# Patient Record
Sex: Male | Born: 1975 | Race: White | Hispanic: No | Marital: Married | Smoking: Never smoker
Health system: Southern US, Community
[De-identification: ages and names within clinical notes are randomized; demographics above are authoritative.]

## PROBLEM LIST (undated history)

## (undated) DIAGNOSIS — N200 Calculus of kidney: Secondary | ICD-10-CM

## (undated) HISTORY — DX: Calculus of kidney: N20.0

---
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
## 2007-10-01 ENCOUNTER — Encounter: Payer: Self-pay | Admitting: Internal Medicine

## 2007-12-22 ENCOUNTER — Ambulatory Visit: Payer: Self-pay | Admitting: Internal Medicine

## 2007-12-22 DIAGNOSIS — J45909 Unspecified asthma, uncomplicated: Secondary | ICD-10-CM | POA: Insufficient documentation

## 2009-02-02 ENCOUNTER — Ambulatory Visit: Payer: Self-pay | Admitting: Internal Medicine

## 2009-02-02 LAB — CONVERTED CEMR LAB
Bilirubin Urine: NEGATIVE
Blood in Urine, dipstick: NEGATIVE
Ketones, urine, test strip: NEGATIVE
Urobilinogen, UA: 0.2

## 2009-02-05 LAB — CONVERTED CEMR LAB: TSH: 2.02 microintl units/mL (ref 0.35–5.50)

## 2009-03-12 LAB — CONVERTED CEMR LAB
Cholesterol: 208 mg/dL — ABNORMAL HIGH (ref 0–200)
HDL: 44.3 mg/dL (ref >39.00)
VLDL: 8.2 mg/dL (ref 0.0–40.0)

## 2010-01-22 ENCOUNTER — Ambulatory Visit (HOSPITAL_BASED_OUTPATIENT_CLINIC_OR_DEPARTMENT_OTHER): Admission: RE | Admit: 2010-01-22 | Discharge: 2010-01-22 | Payer: Self-pay | Admitting: Emergency Medicine

## 2010-01-22 ENCOUNTER — Ambulatory Visit: Payer: Self-pay | Admitting: Radiology

## 2010-01-28 ENCOUNTER — Ambulatory Visit: Payer: Self-pay | Admitting: Internal Medicine

## 2010-01-28 LAB — CONVERTED CEMR LAB
ALT: 22 units/L (ref 0–53)
Albumin: 4.8 g/dL (ref 3.5–5.2)
Alkaline Phosphatase: 54 units/L (ref 39–117)
BUN: 17 mg/dL (ref 6–23)
Basophils Absolute: 0 10*3/uL (ref 0.0–0.1)
Basophils Relative: 0.9 % (ref 0.0–3.0)
Bilirubin Urine: NEGATIVE
Bilirubin, Direct: 0.1 mg/dL (ref 0.0–0.3)
Blood in Urine, dipstick: NEGATIVE
Creatinine, Ser: 1.2 mg/dL (ref 0.4–1.5)
GFR calc non Af Amer: 71.85 mL/min (ref >60)
Glucose, Urine, Semiquant: NEGATIVE
HDL: 53 mg/dL (ref >39.00)
Hemoglobin: 16.4 g/dL (ref 13.0–17.0)
Lymphs Abs: 1.2 10*3/uL (ref 0.7–4.0)
Monocytes Absolute: 0.8 10*3/uL (ref 0.1–1.0)
Monocytes Relative: 17.7 % — ABNORMAL HIGH (ref 3.0–12.0)
Neutrophils Relative %: 51.1 % (ref 43.0–77.0)
Nitrite: NEGATIVE
Potassium: 4.7 meq/L (ref 3.5–5.1)
RBC: 5.04 M/uL (ref 4.22–5.81)
Specific Gravity, Urine: >=1.03
TSH: 3.16 microintl units/mL (ref 0.35–5.50)
Total Bilirubin: 0.7 mg/dL (ref 0.3–1.2)
Triglycerides: 98 mg/dL (ref 0.0–149.0)
Urobilinogen, UA: 0.2
WBC: 4.7 10*3/uL (ref 4.5–10.5)
pH: 5.5

## 2010-02-12 ENCOUNTER — Ambulatory Visit: Payer: Self-pay | Admitting: Internal Medicine

## 2010-05-23 ENCOUNTER — Ambulatory Visit: Payer: Self-pay | Admitting: Internal Medicine

## 2010-09-10 NOTE — Assessment & Plan Note (Signed)
Summary: FLU SHOT//SLM  Nurse Visit   Vital Signs:  Patient profile:   35 year old male Temp:     98.5 degrees F oral  Vitals Entered By: Mervin Kung CMA Duncan Dull) (May 23, 2010 10:36 AM)  Allergies: No Known Drug Allergies  Orders Added: 1)  Admin 1st Vaccine [90471] 2)  Flu Vaccine 88yrs + [27253]   Flu Vaccine Consent Questions     Do you have a history of severe allergic reactions to this vaccine? no    Any prior history of allergic reactions to egg and/or gelatin? no    Do you have a sensitivity to the preservative Thimersol? no    Do you have a past history of Guillan-Barre Syndrome? no    Do you currently have an acute febrile illness? no    Have you ever had a severe reaction to latex? no    Vaccine information given and explained to patient? yes    Are you currently pregnant? no    Lot Number:[id number]   Exp Date:02/08/2011   Site Given  Left Deltoid IM. Nicki Guadalajara Fergerson CMA Duncan Dull)  May 23, 2010 10:37 AM

## 2010-09-10 NOTE — Assessment & Plan Note (Signed)
Summary: CPX/CJR ok per bmp/njr   Vital Signs:  Patient profile:   35 year old male Height:      66.5 inches Weight:      165 pounds BMI:     26.33 Pulse rate:   64 / minute Pulse rhythm:   regular Resp:     12 per minute BP sitting:   108 / 78  (left arm) Cuff size:   regular  Vitals Entered By: Gladis Riffle, RN (February 12, 2010 8:17 AM) CC: cpx, labs done--S/P kidney stone 2-3 weeks ago Is Patient Diabetic? No   CC:  cpx and labs done--S/P kidney stone 2-3 weeks ago.  History of Present Illness: CPX  Preventive Screening-Counseling & Management  Alcohol-Tobacco     Alcohol drinks/day: <1     Smoking Status: never  Current Problems (verified): 1)  Preventive Health Care  (ICD-V70.0) 2)  Asthma  (ICD-493.90)  Current Medications (verified): 1)  Proair Hfa 108 (90 Base) Mcg/act  Aers (Albuterol Sulfate) .... As Needed  Allergies (verified): No Known Drug Allergies  Past History:  Past Medical History: Asthma kidney stone  Physical Exam  General:  Well-developed,well-nourished,in no acute distress; alert,appropriate and cooperative throughout examination Head:  normocephalic and atraumatic.   Eyes:  pupils equal and pupils round.   Ears:  R ear normal and L ear normal.   Neck:  No deformities, masses, or tenderness noted. Chest Wall:  No deformities, masses, tenderness or gynecomastia noted. Lungs:  Normal respiratory effort, chest expands symmetrically. Lungs are clear to auscultation, no crackles or wheezes. Heart:  normal rate and regular rhythm.   Abdomen:  Bowel sounds positive,abdomen soft and non-tender without masses, organomegaly or hernias noted. Msk:  No deformity or scoliosis noted of thoracic or lumbar spine.   Pulses:  R radial normal and L radial normal.   Neurologic:  cranial nerves II-XII intact and gait normal.     Impression & Recommendations:  Problem # 1:  PREVENTIVE HEALTH CARE (ICD-V70.0) health maint UTD  Problem # 2:  ASTHMA  (ICD-493.90) no sxs.  His updated medication list for this problem includes:    Proair Hfa 108 (90 Base) Mcg/act Aers (Albuterol sulfate) .Marland Kitchen... As needed  Complete Medication List: 1)  Proair Hfa 108 (90 Base) Mcg/act Aers (Albuterol sulfate) .... As needed  Appended Document: CPX/CJR ok per bmp/njr   Immunizations Administered:  Tetanus Vaccine:    Vaccine Type: Tdap    Site: right deltoid    Mfr: GlaxoSmithKline    Dose: 0.5 ml    Route: IM    Given by: Gladis Riffle, RN    Exp. Date: 11/02/2011    Lot #: [id number]

## 2011-02-05 ENCOUNTER — Other Ambulatory Visit (INDEPENDENT_AMBULATORY_CARE_PROVIDER_SITE_OTHER): Payer: No Typology Code available for payment source

## 2011-02-05 DIAGNOSIS — Z0000 Encounter for general adult medical examination without abnormal findings: Secondary | ICD-10-CM

## 2011-02-05 LAB — BASIC METABOLIC PANEL
BUN: 16 mg/dL (ref 6–23)
Calcium: 9.6 mg/dL (ref 8.4–10.5)
GFR: 75.65 mL/min (ref >60.00)
Glucose, Bld: 91 mg/dL (ref 70–99)
Potassium: 4.4 mEq/L (ref 3.5–5.1)
Sodium: 141 mEq/L (ref 135–145)

## 2011-02-05 LAB — POCT URINALYSIS DIPSTICK
Bilirubin, UA: NEGATIVE
Glucose, UA: NEGATIVE
Leukocytes, UA: NEGATIVE
Spec Grav, UA: 1.03
Urobilinogen, UA: 0.2
pH, UA: 5.5

## 2011-02-05 LAB — HEPATIC FUNCTION PANEL
ALT: 25 U/L (ref 0–53)
Albumin: 4.8 g/dL (ref 3.5–5.2)

## 2011-02-05 LAB — CBC WITH DIFFERENTIAL/PLATELET
Basophils Absolute: 0 10*3/uL (ref 0.0–0.1)
HCT: 41.8 % (ref 39.0–52.0)
Hemoglobin: 14.4 g/dL (ref 13.0–17.0)
Monocytes Relative: 15.3 % — ABNORMAL HIGH (ref 3.0–12.0)
Neutrophils Relative %: 20.5 % — ABNORMAL LOW (ref 43.0–77.0)

## 2011-02-09 ENCOUNTER — Other Ambulatory Visit (HOSPITAL_COMMUNITY)
Admission: RE | Admit: 2011-02-09 | Discharge: 2011-02-09 | Disposition: A | Discharge status: Home / Self Care | Payer: No Typology Code available for payment source | Source: Ambulatory Visit | Attending: Internal Medicine | Admitting: Internal Medicine

## 2011-02-09 ENCOUNTER — Other Ambulatory Visit: Payer: Self-pay | Admitting: Internal Medicine

## 2011-02-09 DIAGNOSIS — D72820 Lymphocytosis (symptomatic): Secondary | ICD-10-CM | POA: Insufficient documentation

## 2011-03-14 ENCOUNTER — Encounter: Payer: Self-pay | Admitting: Internal Medicine

## 2011-03-18 ENCOUNTER — Ambulatory Visit (INDEPENDENT_AMBULATORY_CARE_PROVIDER_SITE_OTHER): Payer: No Typology Code available for payment source | Admitting: Internal Medicine

## 2011-03-18 ENCOUNTER — Encounter: Payer: Self-pay | Admitting: Internal Medicine

## 2011-03-18 VITALS — BP 102/68 | HR 80 | Temp 98.1°F | Ht 66.5 in | Wt 156.0 lb

## 2011-03-18 DIAGNOSIS — Z0000 Encounter for general adult medical examination without abnormal findings: Secondary | ICD-10-CM

## 2011-03-18 DIAGNOSIS — D72819 Decreased white blood cell count, unspecified: Secondary | ICD-10-CM

## 2011-03-18 LAB — CBC WITH DIFFERENTIAL/PLATELET
Basophils Absolute: 0 10*3/uL (ref 0.0–0.1)
Eosinophils Absolute: 0.2 10*3/uL (ref 0.0–0.7)
HCT: 43 % (ref 39.0–52.0)
Lymphocytes Relative: 35.3 % (ref 12.0–46.0)
Monocytes Absolute: 0.6 10*3/uL (ref 0.1–1.0)
Monocytes Relative: 16.4 % — ABNORMAL HIGH (ref 3.0–12.0)
Neutro Abs: 1.6 10*3/uL (ref 1.4–7.7)
Neutrophils Relative %: 42.4 % — ABNORMAL LOW (ref 43.0–77.0)
Platelets: 155 10*3/uL (ref 150.0–400.0)
RBC: 4.63 Mil/uL (ref 4.22–5.81)
RDW: 13.8 % (ref 11.5–14.6)

## 2011-03-18 NOTE — Progress Notes (Signed)
  Subjective:    Patient ID: Ralph Barnett, male    DOB: 1976-07-18, 35 y.o.   MRN: [id number]  HPI cpx  Some back and pleuritic chest pain Back is "stiff" in the a.m. Mid scapular. Improves during the day (can be exacerbated by lifting) Some right sided chest pain---seems to have started after URI sxs---prolonged cough. Occasionally pain can be sharp.   Cough has resolved.   Past Medical History  Diagnosis Date  . Asthma   . Kidney stone    Past Surgical History  Procedure Date  . Wrist surgery     left wrist    reports that he has never smoked. He does not have any smokeless tobacco history on file. He reports that he drinks alcohol. He reports that he does not use illicit drugs. family history includes Atrial fibrillation in his father and Lymphoma in his mother. No Known Allergies    Review of Systems  patient denies chest pain, shortness of breath, orthopnea. Denies lower extremity edema, abdominal pain, change in appetite, change in bowel movements. Patient denies rashes, musculoskeletal complaints. No other specific complaints in a complete review of systems.      Objective:   Physical Exam   well-developed well-nourished male in no acute distress. HEENT exam atraumatic, normocephalic, neck supple without jugular venous distention. Chest clear to auscultation cardiac exam S1-S2 are regular. Abdominal exam overweight with bowel sounds, soft and nontender. Extremities no edema. Neurologic exam is alert with a normal gait.     Assessment & Plan:  Well Visit

## 2011-03-20 ENCOUNTER — Ambulatory Visit (HOSPITAL_BASED_OUTPATIENT_CLINIC_OR_DEPARTMENT_OTHER)
Admission: RE | Admit: 2011-03-20 | Discharge: 2011-03-20 | Disposition: A | Discharge status: Home / Self Care | Payer: No Typology Code available for payment source | Source: Ambulatory Visit | Attending: Family Medicine | Admitting: Family Medicine

## 2011-03-20 ENCOUNTER — Encounter: Payer: Self-pay | Admitting: Family Medicine

## 2011-03-20 ENCOUNTER — Ambulatory Visit (INDEPENDENT_AMBULATORY_CARE_PROVIDER_SITE_OTHER): Payer: No Typology Code available for payment source | Admitting: Family Medicine

## 2011-03-20 VITALS — BP 106/69 | HR 84 | Temp 98.2°F | Ht 67.0 in | Wt 158.4 lb

## 2011-03-20 DIAGNOSIS — Z87828 Personal history of other (healed) physical injury and trauma: Secondary | ICD-10-CM

## 2011-03-20 DIAGNOSIS — M546 Pain in thoracic spine: Secondary | ICD-10-CM

## 2011-03-21 ENCOUNTER — Encounter: Payer: Self-pay | Admitting: Family Medicine

## 2011-03-21 DIAGNOSIS — M546 Pain in thoracic spine: Secondary | ICD-10-CM | POA: Insufficient documentation

## 2011-03-21 NOTE — Assessment & Plan Note (Signed)
Thoracic back pain - AP and lateral x-rays negative for compression fracture, infiltrative disease.  No significant malalignment.  Reassured patient.  Discussed this is generally a difficult area to rehab (deep thoracic muscles, erector spinae).  Shown scapular stabilization exercises and thoracic stretches.  Heat, nsaids as needed.  Advised him would consider PT as next step if not improving over next 4-6 weeks.  MRI is another option but would only consider if not improving as expected with PT.

## 2011-03-21 NOTE — Progress Notes (Signed)
Subjective:    Patient ID: Ralph Barnett, male    DOB: 25-Oct-1975, 35 y.o.   MRN: [id number]  PCP: Swords  HPI 35 yo M here for upper back/low neck pain  Patient recalls having lower back pain early this year that felt musculoskeletal without any injury. Was worse with movement, has largely subsided. No radiation into legs.  No numbness or tingling No bowel/bladder dysfunction. Then over the past several weeks has developed upper midline thoracic pain/low cervical pain. He does recall earlier this summer he dove into a pool from a height of about 6 meters and had some pain following this but it was not severe, no swelling or bruising.  Did not have any trouble with that dive itself. Over the past 2 days has worsened especially. Taking naprosyn occasionally. Pain worse in morning, better throughout day. No bowel/bladder dysfunction or numbness/tingling following this either. No radiation into arms. Yesterday when rolling around mat playing with his child he felt a pop/crack in same area of his upper thoracic spine. Denies fevers, chills, night sweats.  Past Medical History  Diagnosis Date  . Asthma   . Kidney stone     Current Outpatient Prescriptions on File Prior to Visit  Medication Sig Dispense Refill  . albuterol (PROAIR HFA) 108 (90 BASE) MCG/ACT inhaler Inhale 2 puffs into the lungs every 6 (six) hours as needed.          Past Surgical History  Procedure Date  . Wrist surgery 1997    left wrist for scapular nonunion     No Known Allergies  History   Social History  . Marital Status: Married    Spouse Name: N/A    Number of Children: N/A  . Years of Education: N/A   Occupational History  . Not on file.   Social History Main Topics  . Smoking status: Never Smoker   . Smokeless tobacco: Not on file  . Alcohol Use: Yes  . Drug Use: No  . Sexually Active: Not on file   Other Topics Concern  . Not on file   Social History Narrative  . No narrative on  file    Family History  Problem Relation Age of Onset  . Lymphoma Mother   . Atrial fibrillation Father   . Diabetes Neg Hx   . Heart attack Neg Hx   . Hyperlipidemia Neg Hx   . Hypertension Neg Hx   . Sudden death Neg Hx     BP 106/69  Pulse 84  Temp(Src) 98.2 F (36.8 C) (Oral)  Ht 5\' 7"  (1.702 m)  Wt 158 lb 6.4 oz (71.85 kg)  BMI 24.81 kg/m2  Review of Systems See HPI above.    Objective:   Physical Exam Gen: NAD  Neck: No gross deformity, swelling, bruising. No paraspinal TTP.  No midline/bony TTP. FROM neck - pain with extension at upper t-spine in midline. BUE strength 5/5.  Sensation intact to light touch.  2+ equal reflexes in biceps, 1+ triceps, brachioradialis tendons. Negative spurlings. NV intact distal BUEs.  Back: No gross deformity, scoliosis. No paraspinal TTP lumbar spine.  No midline or bony TTP lumbar spine but midline and immediate bilateral paraspinal TTP about T2 and T3.  No increased pain/severe pain with reflex hammer to spinous processes here. FROM with mild pain on full flexion. Strength LEs 5/5 all muscle groups.  3+ MSRs in patellar, 2+ achilles tendons, equal bilaterally. Negative SLRs. Sensation intact to light touch bilaterally.  Assessment & Plan:  1. Thoracic back pain - AP and lateral x-rays negative for compression fracture, infiltrative disease.  No significant malalignment.  Reassured patient.  Discussed this is generally a difficult area to rehab (deep thoracic muscles, erector spinae).  Shown scapular stabilization exercises and thoracic stretches.  Heat, nsaids as needed.  Advised him would consider PT as next step if not improving over next 4-6 weeks.  MRI is another option but would only consider if not improving as expected with PT.

## 2011-05-13 ENCOUNTER — Ambulatory Visit (INDEPENDENT_AMBULATORY_CARE_PROVIDER_SITE_OTHER): Payer: No Typology Code available for payment source | Admitting: Internal Medicine

## 2011-05-13 ENCOUNTER — Ambulatory Visit (INDEPENDENT_AMBULATORY_CARE_PROVIDER_SITE_OTHER)
Admission: RE | Admit: 2011-05-13 | Discharge: 2011-05-13 | Disposition: A | Discharge status: Home / Self Care | Payer: No Typology Code available for payment source | Source: Ambulatory Visit | Attending: Internal Medicine | Admitting: Internal Medicine

## 2011-05-13 DIAGNOSIS — R079 Chest pain, unspecified: Secondary | ICD-10-CM

## 2011-05-13 DIAGNOSIS — Z23 Encounter for immunization: Secondary | ICD-10-CM

## 2011-05-13 LAB — CBC WITH DIFFERENTIAL/PLATELET
Basophils Relative: 0.4 % (ref 0.0–3.0)
Eosinophils Absolute: 0.1 10*3/uL (ref 0.0–0.7)
HCT: 42.5 % (ref 39.0–52.0)
Monocytes Relative: 13.3 % — ABNORMAL HIGH (ref 3.0–12.0)
Neutro Abs: 1.2 10*3/uL — ABNORMAL LOW (ref 1.4–7.7)
Neutrophils Relative %: 36.2 % — ABNORMAL LOW (ref 43.0–77.0)
WBC: 3.5 10*3/uL — ABNORMAL LOW (ref 4.5–10.5)

## 2011-05-13 LAB — SEDIMENTATION RATE: Sed Rate: 9 mm/hr (ref 0–22)

## 2011-05-13 NOTE — Progress Notes (Signed)
  Subjective:    Patient ID: Ralph Barnett, male    DOB: 03/25/1976, 35 y.o.   MRN: [id number]  HPI  Unusual story of rib/chest pain. Mechanical in nature, bilateral. Ongoing for 6 weeks. Started right sided but then heard and felt a "pop" on left side. Some pain with cough/sneeze. No fever or chills. No weight loss. Admits to some fatigue. Note recent neutropenia---had resolved.   No unusual exposures, no recent travel Family members healthy    Review of Systems  patient denies chest pain, shortness of breath, orthopnea. Denies lower extremity edema, abdominal pain, change in appetite, change in bowel movements. Patient denies rashes, musculoskeletal complaints. No other specific complaints in a complete review of systems.      Objective:   Physical Exam  well-developed well-nourished male in no acute distress. HEENT exam atraumatic, normocephalic, neck supple without jugular venous distention. Chest clear to auscultation cardiac exam S1-S2 are regular. Abdominal exam overweight with bowel sounds, soft and nontender. Extremities no edema. Neurologic exam is alert with a normal gait.    Assessment & Plan:

## 2011-05-14 LAB — D-DIMER, QUANTITATIVE: D-Dimer, Quant: 0.91 ug/mL-FEU — ABNORMAL HIGH (ref 0.00–0.48)

## 2011-05-14 LAB — C-REACTIVE PROTEIN: CRP: 0.14 mg/dL (ref <0.60)

## 2011-05-15 ENCOUNTER — Other Ambulatory Visit: Payer: Self-pay | Admitting: Internal Medicine

## 2011-05-15 ENCOUNTER — Encounter: Payer: Self-pay | Admitting: Internal Medicine

## 2011-05-15 ENCOUNTER — Other Ambulatory Visit (INDEPENDENT_AMBULATORY_CARE_PROVIDER_SITE_OTHER): Payer: No Typology Code available for payment source

## 2011-05-15 ENCOUNTER — Ambulatory Visit (HOSPITAL_COMMUNITY)
Admission: RE | Admit: 2011-05-15 | Discharge: 2011-05-15 | Disposition: A | Discharge status: Home / Self Care | Payer: No Typology Code available for payment source | Source: Ambulatory Visit | Attending: Internal Medicine | Admitting: Internal Medicine

## 2011-05-15 DIAGNOSIS — R937 Abnormal findings on diagnostic imaging of other parts of musculoskeletal system: Secondary | ICD-10-CM | POA: Insufficient documentation

## 2011-05-15 DIAGNOSIS — R071 Chest pain on breathing: Secondary | ICD-10-CM | POA: Insufficient documentation

## 2011-05-15 DIAGNOSIS — R079 Chest pain, unspecified: Secondary | ICD-10-CM | POA: Insufficient documentation

## 2011-05-15 DIAGNOSIS — R7989 Other specified abnormal findings of blood chemistry: Secondary | ICD-10-CM | POA: Insufficient documentation

## 2011-05-15 DIAGNOSIS — M899 Disorder of bone, unspecified: Secondary | ICD-10-CM

## 2011-05-15 DIAGNOSIS — M549 Dorsalgia, unspecified: Secondary | ICD-10-CM

## 2011-05-15 DIAGNOSIS — M898X9 Other specified disorders of bone, unspecified site: Secondary | ICD-10-CM | POA: Insufficient documentation

## 2011-05-15 LAB — HEPATIC FUNCTION PANEL
AST: 26 U/L (ref 0–37)
Alkaline Phosphatase: 118 U/L — ABNORMAL HIGH (ref 39–117)
Total Bilirubin: 0.9 mg/dL (ref 0.3–1.2)

## 2011-05-15 LAB — HCG, QUANTITATIVE, PREGNANCY: hCG, Beta Chain, Quant, S: 0.62 m[IU]/mL

## 2011-05-15 LAB — BASIC METABOLIC PANEL
BUN: 16 mg/dL (ref 6–23)
Chloride: 101 mEq/L (ref 96–112)
Sodium: 137 mEq/L (ref 135–145)

## 2011-05-15 MED ORDER — IOHEXOL 350 MG/ML SOLN
100.0000 mL | Freq: Once | INTRAVENOUS | Status: AC | PRN
  Administered 2011-05-15: 100 mL via INTRAVENOUS

## 2011-05-15 MED ORDER — MELOXICAM 15 MG PO TABS: 15.0000 mg | ORAL_TABLET | Freq: Every day | ORAL | Status: DC

## 2011-05-15 NOTE — Assessment & Plan Note (Addendum)
Unusual story of persistent chest pain. Migratory nature at times.  Needs further evaluation.  Check labs today. Basic labs reviewed Note recent leukopenia/neutropenia---has essentially resolved---douubt related  Later---ddimer elevated---CT angio

## 2011-05-16 LAB — CBC WITH DIFFERENTIAL/PLATELET
Eosinophils Relative: 2.3 % (ref 0.0–5.0)
Monocytes Relative: 11.4 % (ref 3.0–12.0)
Neutrophils Relative %: 46.7 % (ref 43.0–77.0)
Platelets: 186 10*3/uL (ref 150.0–400.0)
WBC: 3.7 10*3/uL — ABNORMAL LOW (ref 4.5–10.5)

## 2011-05-16 LAB — CALCIUM, IONIZED: Calcium, Ion: 1.37 mmol/L — ABNORMAL HIGH (ref 1.12–1.32)

## 2011-05-16 LAB — AFP TUMOR MARKER: AFP-Tumor Marker: 2 ng/mL (ref 0.0–8.0)

## 2011-05-19 ENCOUNTER — Encounter: Payer: No Typology Code available for payment source | Admitting: Oncology

## 2011-05-19 ENCOUNTER — Other Ambulatory Visit: Payer: Self-pay | Admitting: *Deleted

## 2011-05-19 LAB — SPEP & IFE WITH QIG
IgA: 9 mg/dL — ABNORMAL LOW (ref 68–379)
IgG (Immunoglobin G), Serum: 233 mg/dL — ABNORMAL LOW (ref 650–1600)
Total Protein, Serum Electrophoresis: 6.9 g/dL (ref 6.0–8.3)

## 2011-05-19 LAB — PTH, INTACT AND CALCIUM
Calcium, Total (PTH): 10.6 mg/dL — ABNORMAL HIGH (ref 8.4–10.5)
PTH: <2.5 pg/mL — ABNORMAL LOW (ref 14.0–72.0)

## 2011-05-20 LAB — KAPPA/LAMBDA LIGHT CHAINS: Kappa free light chain: 10.4 mg/dL — ABNORMAL HIGH (ref 0.33–1.94)

## 2011-05-21 ENCOUNTER — Other Ambulatory Visit (HOSPITAL_COMMUNITY)
Admission: RE | Admit: 2011-05-21 | Discharge: 2011-05-21 | Disposition: A | Discharge status: Home / Self Care | Payer: No Typology Code available for payment source | Source: Ambulatory Visit | Attending: Oncology | Admitting: Oncology

## 2011-05-21 ENCOUNTER — Other Ambulatory Visit: Payer: Self-pay | Admitting: Oncology

## 2011-05-21 ENCOUNTER — Encounter (HOSPITAL_BASED_OUTPATIENT_CLINIC_OR_DEPARTMENT_OTHER): Payer: No Typology Code available for payment source | Admitting: Oncology

## 2011-05-21 ENCOUNTER — Ambulatory Visit (HOSPITAL_COMMUNITY)
Admission: RE | Admit: 2011-05-21 | Discharge: 2011-05-21 | Disposition: A | Discharge status: Home / Self Care | Payer: No Typology Code available for payment source | Source: Ambulatory Visit | Attending: Oncology | Admitting: Oncology

## 2011-05-21 DIAGNOSIS — C9000 Multiple myeloma not having achieved remission: Secondary | ICD-10-CM

## 2011-05-21 DIAGNOSIS — C801 Malignant (primary) neoplasm, unspecified: Secondary | ICD-10-CM

## 2011-05-21 DIAGNOSIS — D801 Nonfamilial hypogammaglobulinemia: Secondary | ICD-10-CM

## 2011-05-21 DIAGNOSIS — R071 Chest pain on breathing: Secondary | ICD-10-CM | POA: Insufficient documentation

## 2011-05-21 DIAGNOSIS — Z1389 Encounter for screening for other disorder: Secondary | ICD-10-CM | POA: Insufficient documentation

## 2011-05-21 DIAGNOSIS — M899 Disorder of bone, unspecified: Secondary | ICD-10-CM | POA: Insufficient documentation

## 2011-05-21 DIAGNOSIS — M549 Dorsalgia, unspecified: Secondary | ICD-10-CM | POA: Insufficient documentation

## 2011-05-21 DIAGNOSIS — M25559 Pain in unspecified hip: Secondary | ICD-10-CM | POA: Insufficient documentation

## 2011-05-21 LAB — CBC WITH DIFFERENTIAL/PLATELET
Eosinophils Absolute: 0.1 10*3/uL (ref 0.0–0.5)
LYMPH%: 51.2 % — ABNORMAL HIGH (ref 14.0–49.0)
MCHC: 35 g/dL (ref 32.0–36.0)
MCV: 89.7 fL (ref 79.3–98.0)
MONO%: 13.2 % (ref 0.0–14.0)
NEUT#: 1.1 10*3/uL — ABNORMAL LOW (ref 1.5–6.5)
NEUT%: 31.4 % — ABNORMAL LOW (ref 39.0–75.0)
Platelets: 205 10*3/uL (ref 140–400)
RBC: 4.46 10*6/uL (ref 4.20–5.82)
nRBC: 0 % (ref 0–0)

## 2011-05-21 LAB — UIFE/LIGHT CHAINS/TP QN, 24-HR UR
Albumin, U: DETECTED
Alpha 1, Urine: DETECTED — AB
Free Kappa/Lambda Ratio: 22.33 ratio — ABNORMAL HIGH (ref 2.04–10.37)
Free Lambda Excretion/Day: 0.68 mg/d
Total Protein, Urine: 1.2 mg/dL

## 2011-05-22 ENCOUNTER — Ambulatory Visit
Admission: RE | Admit: 2011-05-22 | Discharge: 2011-05-22 | Disposition: A | Discharge status: Home / Self Care | Payer: No Typology Code available for payment source | Source: Ambulatory Visit | Attending: Oncology | Admitting: Oncology

## 2011-05-22 ENCOUNTER — Encounter (HOSPITAL_BASED_OUTPATIENT_CLINIC_OR_DEPARTMENT_OTHER): Payer: No Typology Code available for payment source | Admitting: Oncology

## 2011-05-22 DIAGNOSIS — C9000 Multiple myeloma not having achieved remission: Secondary | ICD-10-CM

## 2011-05-22 MED ORDER — GADOBENATE DIMEGLUMINE 529 MG/ML IV SOLN
14.0000 mL | Freq: Once | INTRAVENOUS | Status: AC | PRN
  Administered 2011-05-22: 14 mL via INTRAVENOUS

## 2011-05-27 LAB — LACTATE DEHYDROGENASE: LDH: 127 U/L (ref 94–250)

## 2011-05-27 LAB — COMPREHENSIVE METABOLIC PANEL
Alkaline Phosphatase: 120 U/L — ABNORMAL HIGH (ref 39–117)
BUN: 14 mg/dL (ref 6–23)
CO2: 25 mEq/L (ref 19–32)
Creatinine, Ser: 1.29 mg/dL (ref 0.50–1.35)
Glucose, Bld: 93 mg/dL (ref 70–99)
Sodium: 142 mEq/L (ref 135–145)
Total Bilirubin: 0.4 mg/dL (ref 0.3–1.2)
Total Protein: 6.7 g/dL (ref 6.0–8.3)

## 2011-05-27 LAB — EPSTEIN-BARR VIRUS VCA, IGG: EBV VCA IgG: 0.02 {ISR}

## 2011-05-27 LAB — EPSTEIN-BARR VIRUS EARLY D ANTIGEN ANTIBODY, IGG: EBV EA IgG: 0.07 {ISR}

## 2011-05-27 LAB — IMMUNOFIXATION ELECTROPHORESIS
IgA: 10 mg/dL — ABNORMAL LOW (ref 68–379)
IgG (Immunoglobin G), Serum: 257 mg/dL — ABNORMAL LOW (ref 650–1600)
IgM, Serum: <4 mg/dL — ABNORMAL LOW (ref 41–251)
Total Protein, Serum Electrophoresis: 6.7 g/dL (ref 6.0–8.3)

## 2011-05-27 LAB — HEPATITIS PANEL, ACUTE
HCV Ab: NEGATIVE
Hepatitis B Surface Ag: NEGATIVE

## 2011-05-27 LAB — CMV IGM: CMV IgM: 0.04 (ref <0.90)

## 2011-05-27 LAB — EPSTEIN-BARR VIRUS VCA, IGM: EBV VCA IgM: 0.02 {ISR}

## 2011-05-28 ENCOUNTER — Encounter (HOSPITAL_BASED_OUTPATIENT_CLINIC_OR_DEPARTMENT_OTHER): Payer: No Typology Code available for payment source | Admitting: Oncology

## 2011-05-28 DIAGNOSIS — C9000 Multiple myeloma not having achieved remission: Secondary | ICD-10-CM

## 2011-05-28 DIAGNOSIS — Z5112 Encounter for antineoplastic immunotherapy: Secondary | ICD-10-CM

## 2011-05-29 ENCOUNTER — Other Ambulatory Visit (HOSPITAL_COMMUNITY): Payer: No Typology Code available for payment source

## 2011-05-29 ENCOUNTER — Ambulatory Visit (INDEPENDENT_AMBULATORY_CARE_PROVIDER_SITE_OTHER): Payer: No Typology Code available for payment source | Admitting: Internal Medicine

## 2011-05-29 DIAGNOSIS — Z23 Encounter for immunization: Secondary | ICD-10-CM

## 2011-06-02 ENCOUNTER — Other Ambulatory Visit: Payer: Self-pay | Admitting: Oncology

## 2011-06-02 ENCOUNTER — Encounter (HOSPITAL_BASED_OUTPATIENT_CLINIC_OR_DEPARTMENT_OTHER): Payer: No Typology Code available for payment source | Admitting: Oncology

## 2011-06-02 DIAGNOSIS — D492 Neoplasm of unspecified behavior of bone, soft tissue, and skin: Secondary | ICD-10-CM

## 2011-06-02 DIAGNOSIS — E8809 Other disorders of plasma-protein metabolism, not elsewhere classified: Secondary | ICD-10-CM

## 2011-06-02 LAB — CBC WITH DIFFERENTIAL/PLATELET
BASO%: 0.3 % (ref 0.0–2.0)
Eosinophils Absolute: 0.1 10*3/uL (ref 0.0–0.5)
LYMPH%: 19.8 % (ref 14.0–49.0)
MCHC: 34.4 g/dL (ref 32.0–36.0)
MONO#: 0.3 10*3/uL (ref 0.1–0.9)
NEUT#: 4.4 10*3/uL (ref 1.5–6.5)
RBC: 4.39 10*6/uL (ref 4.20–5.82)
RDW: 13.8 % (ref 11.0–14.6)
WBC: 6.1 10*3/uL (ref 4.0–10.3)

## 2011-06-05 ENCOUNTER — Encounter (HOSPITAL_BASED_OUTPATIENT_CLINIC_OR_DEPARTMENT_OTHER): Payer: No Typology Code available for payment source | Admitting: Oncology

## 2011-06-05 DIAGNOSIS — C9000 Multiple myeloma not having achieved remission: Secondary | ICD-10-CM

## 2011-06-05 DIAGNOSIS — Z5112 Encounter for antineoplastic immunotherapy: Secondary | ICD-10-CM

## 2011-06-09 ENCOUNTER — Other Ambulatory Visit: Payer: Self-pay | Admitting: Oncology

## 2011-06-09 ENCOUNTER — Encounter (HOSPITAL_BASED_OUTPATIENT_CLINIC_OR_DEPARTMENT_OTHER): Payer: No Typology Code available for payment source | Admitting: Oncology

## 2011-06-09 DIAGNOSIS — C9000 Multiple myeloma not having achieved remission: Secondary | ICD-10-CM

## 2011-06-09 DIAGNOSIS — Z5112 Encounter for antineoplastic immunotherapy: Secondary | ICD-10-CM

## 2011-06-09 DIAGNOSIS — E8809 Other disorders of plasma-protein metabolism, not elsewhere classified: Secondary | ICD-10-CM

## 2011-06-09 DIAGNOSIS — D492 Neoplasm of unspecified behavior of bone, soft tissue, and skin: Secondary | ICD-10-CM

## 2011-06-09 LAB — CBC WITH DIFFERENTIAL/PLATELET
BASO%: 0.3 % (ref 0.0–2.0)
Eosinophils Absolute: 0.2 10*3/uL (ref 0.0–0.5)
HCT: 40.6 % (ref 38.4–49.9)
LYMPH%: 19.9 % (ref 14.0–49.0)
MCHC: 33.7 g/dL (ref 32.0–36.0)
MONO#: 0.5 10*3/uL (ref 0.1–0.9)
NEUT#: 4.6 10*3/uL (ref 1.5–6.5)
NEUT%: 68.6 % (ref 39.0–75.0)
Platelets: 128 10*3/uL — ABNORMAL LOW (ref 140–400)
RBC: 4.4 10*6/uL (ref 4.20–5.82)
WBC: 6.8 10*3/uL (ref 4.0–10.3)
lymph#: 1.3 10*3/uL (ref 0.9–3.3)
nRBC: 0 % (ref 0–0)

## 2011-06-16 ENCOUNTER — Other Ambulatory Visit: Payer: Self-pay

## 2011-06-16 ENCOUNTER — Telehealth: Payer: Self-pay | Admitting: *Deleted

## 2011-06-16 MED ORDER — LENALIDOMIDE 25 MG PO CAPS: ORAL_CAPSULE | ORAL | Status: DC

## 2011-06-16 NOTE — Telephone Encounter (Signed)
RECEIVED A FAX FROM [facility] CONCERNING A PRESCRIPTION REFILL REQUEST FOR REVLIMID. THIS REQUEST WAS PLACED IN DR.GRANFORTUNA'S IN BOX.

## 2011-06-17 ENCOUNTER — Ambulatory Visit (HOSPITAL_BASED_OUTPATIENT_CLINIC_OR_DEPARTMENT_OTHER): Payer: No Typology Code available for payment source | Admitting: Oncology

## 2011-06-17 ENCOUNTER — Other Ambulatory Visit: Payer: Self-pay | Admitting: Oncology

## 2011-06-17 ENCOUNTER — Encounter: Payer: Self-pay | Admitting: Oncology

## 2011-06-17 ENCOUNTER — Other Ambulatory Visit (HOSPITAL_BASED_OUTPATIENT_CLINIC_OR_DEPARTMENT_OTHER): Payer: No Typology Code available for payment source | Admitting: Lab

## 2011-06-17 DIAGNOSIS — D492 Neoplasm of unspecified behavior of bone, soft tissue, and skin: Secondary | ICD-10-CM

## 2011-06-17 DIAGNOSIS — K5900 Constipation, unspecified: Secondary | ICD-10-CM

## 2011-06-17 DIAGNOSIS — R05 Cough: Secondary | ICD-10-CM

## 2011-06-17 DIAGNOSIS — E8809 Other disorders of plasma-protein metabolism, not elsewhere classified: Secondary | ICD-10-CM

## 2011-06-17 DIAGNOSIS — D72819 Decreased white blood cell count, unspecified: Secondary | ICD-10-CM

## 2011-06-17 DIAGNOSIS — C9000 Multiple myeloma not having achieved remission: Secondary | ICD-10-CM

## 2011-06-17 LAB — CBC WITH DIFFERENTIAL/PLATELET
BASO%: 0.3 % (ref 0.0–2.0)
EOS%: 5.3 % (ref 0.0–7.0)
HCT: 37 % — ABNORMAL LOW (ref 38.4–49.9)
LYMPH%: 38.4 % (ref 14.0–49.0)
MCH: 31.1 pg (ref 27.2–33.4)
MCHC: 33.5 g/dL (ref 32.0–36.0)
NEUT%: 35.7 % — ABNORMAL LOW (ref 39.0–75.0)
lymph#: 1.4 10*3/uL (ref 0.9–3.3)

## 2011-06-17 NOTE — Progress Notes (Signed)
CC:   Jaclyn Prime. Greggory Stallion, M.D. 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  He was not anemic.  Calcium was borderline elevated.  Serum total protein and albumin were normal.  Creatinine borderline elevated 1.2 to 1.5 on various readings.  He had a mild leukopenia which was chronic.  Diagnosis initially suspected when a D-dimer was done in view of pleuritic chest symptoms and was elevated which led to a CT angiogram done 05/15/2011 which did not show any evidence for pulmonary emboli, but showed numerous lytic bone lesions in the spine, ribs and sternum.  Further evaluation showed suppression of all immunoglobulins in the serum.  No monoclonal protein seen on IFE, but a serum free light chain analysis showed a mild elevation of kappa free light chains at 10.4 mg%. 24-hour urine for total protein did not show elevated protein or any monoclonal free light chains.  Beta2-microglobulin only borderline elevated at 3.95.  MRI scans of the spine showed diffuse bone involvement, but no evidence for any impending cord compression.  Heaviest area of involvement was actually in the sacral area and lumbar spine.  Bone marrow biopsy done 05/21/2011 showed a hypercellular marrow with 42% plasma cells.  Many of these were immature.  There were large aggregates and sheets of plasma cells in some areas.  There were prominent Dutcher bodies in many of the cells.  Immunohistochemical stains were strongly positive for CD138 with kappa and lambda stain showing kappa light chain restriction.  After full discussion of treatment options, we mutually elected to initiate treatment with induction program based on study published by Dr. Shirlean Mylar last July in Blood using a classic day 1, day 4, day 8, day 15  Velcade schedule 1.3 mg/sq m except giving the drug subcutaneously instead of IV, pulse dexamethasone total dose 40 mg per week, and Revlimid 25 mg day 1 through 14 of a 21-day cycle.  While initial evaluation was in progress, I did give him an induction treatment with 4 days of dexamethasone at 40 mg daily and a single dose of Velcade.  Overall, he is tolerating therapy well.  Bone pain is much better controlled.  He is just using ibuprofen at this time.  He did see Dr. Marlaine Hind yesterday at [facility] in anticipation of high- dose IV melphalan with autologous stem cell support in the near future after he completes approximately 4 cycles of induction treatment outlined above.  So far, only side effect from the treatment is constipation and some of this is related to previous low-dose narcotics he was taking for his back pain.  He has not developed any paresthesias.  He has had a mild cough over the last few days, nonproductive.  No fever.  He is on prophylactic acyclovir, dose increased from 400 b.i.d. to 800 b.i.d. per Dr. Hezzie Bump recommendation yesterday, and low level thromboprophylaxis with aspirin dose increased from 81 mg to 325 mg daily per Dr. Hezzie Bump recommendation yesterday.  PHYSICAL EXAMINATION:  Vital Signs:  Blood pressure 107/69, pulse 79 and regular, temp 97.1.  Head and neck:  Normal.  Pharynx:  No erythema or exudate.  Lungs:  Clear and resonant to percussion.  Heart:  Regular cardiac rhythm.  No murmur.  Abdomen:  Soft, nontender.  Extremities: No edema.  No calf tenderness.  Neurologic:  Motor strength 5/5.  Reflexes 2+ symmetric.  Sensation intact to vibration over the fingertips by tuning fork exam.  LABORATORY DATA:  Hemoglobin 12.4, hematocrit 37, white count 3600 with 36% neutrophils, 38% lymphocytes, 20 monocytes, and platelets 206,000 at the nadir from cycle 1 of RVD.  Chem profile and serum light chain analysis pending.  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  PLAN:  Continued induction RVD as outlined above and then re-evaluation at [facility] in [place] for consolidation with high- dose chemotherapy with autologous stem cell support.  In addition to the RVD, I forgot to mention he is also going to get monthly Zometa and has already had 1 dose.  We discussed the possible rare side effect of osteonecrosis of the jaw today.    ______________________________ [name], M.D., F.A.C.P. JMG/MEDQ  D:  06/17/2011  T:  06/17/2011  Job:  [id number]

## 2011-06-18 LAB — COMPREHENSIVE METABOLIC PANEL
AST: 25 U/L (ref 0–37)
BUN: 11 mg/dL (ref 6–23)
Calcium: 9 mg/dL (ref 8.4–10.5)
Chloride: 104 mEq/L (ref 96–112)
Creatinine, Ser: 1.26 mg/dL (ref 0.50–1.35)
Total Bilirubin: 0.5 mg/dL (ref 0.3–1.2)

## 2011-06-18 LAB — KAPPA/LAMBDA LIGHT CHAINS: Kappa:Lambda Ratio: 518.97 — ABNORMAL HIGH (ref 0.26–1.65)

## 2011-06-20 ENCOUNTER — Telehealth: Payer: Self-pay | Admitting: Oncology

## 2011-06-20 NOTE — Telephone Encounter (Addendum)
Received fax from [facility] on pt. stating that revlimid script was filled & scheduled for delivery at pt's home 06/19/11.

## 2011-06-20 NOTE — Telephone Encounter (Signed)
Pt aware of appts and PET Scan on 11/20 0645am @ [facility]

## 2011-06-23 ENCOUNTER — Ambulatory Visit: Payer: No Typology Code available for payment source

## 2011-06-23 ENCOUNTER — Other Ambulatory Visit (HOSPITAL_BASED_OUTPATIENT_CLINIC_OR_DEPARTMENT_OTHER): Payer: No Typology Code available for payment source | Admitting: Lab

## 2011-06-23 ENCOUNTER — Ambulatory Visit (HOSPITAL_BASED_OUTPATIENT_CLINIC_OR_DEPARTMENT_OTHER): Payer: No Typology Code available for payment source

## 2011-06-23 ENCOUNTER — Other Ambulatory Visit: Payer: Self-pay | Admitting: Oncology

## 2011-06-23 DIAGNOSIS — C9000 Multiple myeloma not having achieved remission: Secondary | ICD-10-CM

## 2011-06-23 DIAGNOSIS — M899 Disorder of bone, unspecified: Secondary | ICD-10-CM

## 2011-06-23 LAB — CBC WITH DIFFERENTIAL/PLATELET
BASO%: 0.6 % (ref 0.0–2.0)
Eosinophils Absolute: 0.1 10*3/uL (ref 0.0–0.5)
HCT: 37 % — ABNORMAL LOW (ref 38.4–49.9)
MCHC: 34.3 g/dL (ref 32.0–36.0)
MONO#: 0.5 10*3/uL (ref 0.1–0.9)
NEUT#: 0.9 10*3/uL — ABNORMAL LOW (ref 1.5–6.5)
RBC: 4.04 10*6/uL — ABNORMAL LOW (ref 4.20–5.82)
WBC: 3.5 10*3/uL — ABNORMAL LOW (ref 4.0–10.3)
lymph#: 2.1 10*3/uL (ref 0.9–3.3)
nRBC: 0 % (ref 0–0)

## 2011-06-23 MED ORDER — ZOLEDRONIC ACID 4 MG/5ML IV CONC
4.0000 mg | Freq: Once | INTRAVENOUS | Status: DC
  Filled 2011-06-23: qty 5

## 2011-06-23 MED ORDER — BORTEZOMIB CHEMO SQ INJECTION 3.5 MG (2.5MG/ML): 1.3000 mg/m2 | Freq: Once | INTRAMUSCULAR | Status: DC

## 2011-06-23 MED ORDER — ZOLEDRONIC ACID 4 MG/100ML IV SOLN
4.0000 mg | Freq: Once | INTRAVENOUS | Status: AC
  Administered 2011-06-23: 4 mg via INTRAVENOUS
  Filled 2011-06-23: qty 100

## 2011-06-23 NOTE — Progress Notes (Signed)
Pt at [facility] for Velcade today - held d/t low ANC.  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  Per Dr Cyndie Chime, if pt wishes to work, he can take Cipro until his counts recover.  Pt reports he will not work.  Does not wish to have Cipro called in at this time. dph

## 2011-06-26 ENCOUNTER — Other Ambulatory Visit: Payer: No Typology Code available for payment source | Admitting: Lab

## 2011-06-26 ENCOUNTER — Ambulatory Visit: Payer: No Typology Code available for payment source

## 2011-07-01 ENCOUNTER — Other Ambulatory Visit: Payer: Self-pay | Admitting: Oncology

## 2011-07-01 ENCOUNTER — Ambulatory Visit (HOSPITAL_BASED_OUTPATIENT_CLINIC_OR_DEPARTMENT_OTHER): Payer: No Typology Code available for payment source

## 2011-07-01 ENCOUNTER — Telehealth: Payer: Self-pay | Admitting: Oncology

## 2011-07-01 ENCOUNTER — Other Ambulatory Visit: Payer: Self-pay | Admitting: *Deleted

## 2011-07-01 ENCOUNTER — Other Ambulatory Visit (HOSPITAL_BASED_OUTPATIENT_CLINIC_OR_DEPARTMENT_OTHER): Payer: No Typology Code available for payment source | Admitting: Lab

## 2011-07-01 ENCOUNTER — Encounter (HOSPITAL_COMMUNITY): Payer: Self-pay

## 2011-07-01 ENCOUNTER — Encounter (HOSPITAL_COMMUNITY)
Admission: RE | Admit: 2011-07-01 | Discharge: 2011-07-01 | Disposition: A | Discharge status: Home / Self Care | Payer: No Typology Code available for payment source | Source: Ambulatory Visit | Attending: Oncology | Admitting: Oncology

## 2011-07-01 DIAGNOSIS — Z5112 Encounter for antineoplastic immunotherapy: Secondary | ICD-10-CM

## 2011-07-01 DIAGNOSIS — M899 Disorder of bone, unspecified: Secondary | ICD-10-CM

## 2011-07-01 DIAGNOSIS — C9000 Multiple myeloma not having achieved remission: Secondary | ICD-10-CM

## 2011-07-01 LAB — CBC WITH DIFFERENTIAL/PLATELET
BASO%: 2 % (ref 0.0–2.0)
LYMPH%: 59.1 % — ABNORMAL HIGH (ref 14.0–49.0)
MCHC: 34 g/dL (ref 32.0–36.0)
MONO#: 0.5 10*3/uL (ref 0.1–0.9)
Platelets: 187 10*3/uL (ref 140–400)
RBC: 4.27 10*6/uL (ref 4.20–5.82)
RDW: 14.3 % (ref 11.0–14.6)
WBC: 2.8 10*3/uL — ABNORMAL LOW (ref 4.0–10.3)

## 2011-07-01 MED ORDER — BORTEZOMIB CHEMO SQ INJECTION 3.5 MG (2.5MG/ML)
1.3000 mg/m2 | Freq: Once | INTRAMUSCULAR | Status: AC
  Administered 2011-07-01: 2.5 mg via SUBCUTANEOUS
  Filled 2011-07-01: qty 2.5

## 2011-07-01 MED ORDER — ONDANSETRON HCL 8 MG PO TABS: 8.0000 mg | ORAL_TABLET | Freq: Once | ORAL | Status: DC

## 2011-07-01 MED ORDER — FLUDEOXYGLUCOSE F - 18 (FDG) INJECTION
18.0000 | Freq: Once | INTRAVENOUS | Status: AC | PRN
  Administered 2011-07-01: 18 via INTRAVENOUS

## 2011-07-01 NOTE — Telephone Encounter (Signed)
New tx orders given today. Pt given apps for nov thru [facility] Dew for nurse rquesting lb order for 11/26, 12/17 and for neulasta inj tomorrow.

## 2011-07-01 NOTE — Progress Notes (Signed)
Per Dr. Cyndie Chime- ok to treat with Velcade despite ANC.

## 2011-07-01 NOTE — Telephone Encounter (Signed)
Added appts for 11/21, 11/26 and 12/17. lmonvm for pt re these appts d/t. Pt given other appts for nov thru jan prior to leaving today and was aware i would call w/appts above.

## 2011-07-02 ENCOUNTER — Ambulatory Visit (HOSPITAL_BASED_OUTPATIENT_CLINIC_OR_DEPARTMENT_OTHER): Payer: No Typology Code available for payment source

## 2011-07-02 DIAGNOSIS — C9000 Multiple myeloma not having achieved remission: Secondary | ICD-10-CM

## 2011-07-02 DIAGNOSIS — M899 Disorder of bone, unspecified: Secondary | ICD-10-CM

## 2011-07-02 LAB — GLUCOSE, CAPILLARY: Glucose-Capillary: 94 mg/dL (ref 70–99)

## 2011-07-02 MED ORDER — PEGFILGRASTIM INJECTION 6 MG/0.6ML
6.0000 mg | Freq: Once | SUBCUTANEOUS | Status: AC
  Administered 2011-07-02: 6 mg via SUBCUTANEOUS
  Filled 2011-07-02: qty 0.6

## 2011-07-04 ENCOUNTER — Ambulatory Visit (HOSPITAL_BASED_OUTPATIENT_CLINIC_OR_DEPARTMENT_OTHER): Payer: No Typology Code available for payment source

## 2011-07-04 ENCOUNTER — Other Ambulatory Visit: Payer: Self-pay | Admitting: Oncology

## 2011-07-04 ENCOUNTER — Other Ambulatory Visit: Payer: No Typology Code available for payment source | Admitting: Lab

## 2011-07-04 DIAGNOSIS — Z5112 Encounter for antineoplastic immunotherapy: Secondary | ICD-10-CM

## 2011-07-04 DIAGNOSIS — C9000 Multiple myeloma not having achieved remission: Secondary | ICD-10-CM

## 2011-07-04 DIAGNOSIS — M899 Disorder of bone, unspecified: Secondary | ICD-10-CM

## 2011-07-04 LAB — CBC WITH DIFFERENTIAL/PLATELET
Basophils Absolute: 0 10*3/uL (ref 0.0–0.1)
Eosinophils Absolute: 0.1 10*3/uL (ref 0.0–0.5)
HGB: 13.8 g/dL (ref 13.0–17.1)
NEUT#: 13.5 10*3/uL — ABNORMAL HIGH (ref 1.5–6.5)
RDW: 14.5 % (ref 11.0–14.6)
lymph#: 1.3 10*3/uL (ref 0.9–3.3)

## 2011-07-04 MED ORDER — BORTEZOMIB CHEMO SQ INJECTION 3.5 MG (2.5MG/ML)
1.3000 mg/m2 | Freq: Once | INTRAMUSCULAR | Status: AC
  Administered 2011-07-04: 2.5 mg via SUBCUTANEOUS
  Filled 2011-07-04: qty 2.5

## 2011-07-04 MED ORDER — ONDANSETRON HCL 8 MG PO TABS
8.0000 mg | ORAL_TABLET | Freq: Once | ORAL | Status: AC
  Administered 2011-07-04: 8 mg via ORAL

## 2011-07-04 NOTE — Progress Notes (Signed)
Verbal consent given by Dr. Cyndie Chime to give Velcade SQ despite  lastr ANC on 07/01/11 of 0.5

## 2011-07-07 ENCOUNTER — Other Ambulatory Visit: Payer: No Typology Code available for payment source | Admitting: Lab

## 2011-07-07 ENCOUNTER — Other Ambulatory Visit: Payer: Self-pay | Admitting: Oncology

## 2011-07-07 ENCOUNTER — Ambulatory Visit (HOSPITAL_BASED_OUTPATIENT_CLINIC_OR_DEPARTMENT_OTHER): Payer: No Typology Code available for payment source

## 2011-07-07 DIAGNOSIS — Z5112 Encounter for antineoplastic immunotherapy: Secondary | ICD-10-CM

## 2011-07-07 DIAGNOSIS — C9000 Multiple myeloma not having achieved remission: Secondary | ICD-10-CM

## 2011-07-07 DIAGNOSIS — M899 Disorder of bone, unspecified: Secondary | ICD-10-CM

## 2011-07-07 LAB — CBC WITH DIFFERENTIAL/PLATELET
Basophils Absolute: 0.1 10*3/uL (ref 0.0–0.1)
Eosinophils Absolute: 0.3 10*3/uL (ref 0.0–0.5)
HGB: 13.4 g/dL (ref 13.0–17.1)
LYMPH%: 11.7 % — ABNORMAL LOW (ref 14.0–49.0)
MCV: 94.7 fL (ref 79.3–98.0)
MONO%: 12.8 % (ref 0.0–14.0)
NEUT#: 11.6 10*3/uL — ABNORMAL HIGH (ref 1.5–6.5)
Platelets: 93 10*3/uL — ABNORMAL LOW (ref 140–400)

## 2011-07-07 MED ORDER — BORTEZOMIB CHEMO SQ INJECTION 3.5 MG (2.5MG/ML)
1.3000 mg/m2 | Freq: Once | INTRAMUSCULAR | Status: AC
  Administered 2011-07-07: 2.5 mg via SUBCUTANEOUS
  Filled 2011-07-07: qty 2.5

## 2011-07-10 ENCOUNTER — Other Ambulatory Visit: Payer: Self-pay | Admitting: Oncology

## 2011-07-10 ENCOUNTER — Ambulatory Visit (HOSPITAL_BASED_OUTPATIENT_CLINIC_OR_DEPARTMENT_OTHER): Payer: PRIVATE HEALTH INSURANCE

## 2011-07-10 ENCOUNTER — Ambulatory Visit (HOSPITAL_BASED_OUTPATIENT_CLINIC_OR_DEPARTMENT_OTHER): Payer: No Typology Code available for payment source

## 2011-07-10 VITALS — BP 119/65 | HR 82 | Temp 97.7°F

## 2011-07-10 DIAGNOSIS — M899 Disorder of bone, unspecified: Secondary | ICD-10-CM

## 2011-07-10 DIAGNOSIS — C9000 Multiple myeloma not having achieved remission: Secondary | ICD-10-CM

## 2011-07-10 DIAGNOSIS — D492 Neoplasm of unspecified behavior of bone, soft tissue, and skin: Secondary | ICD-10-CM

## 2011-07-10 DIAGNOSIS — E8809 Other disorders of plasma-protein metabolism, not elsewhere classified: Secondary | ICD-10-CM

## 2011-07-10 LAB — CBC WITH DIFFERENTIAL/PLATELET
Eosinophils Absolute: 0.3 10*3/uL (ref 0.0–0.5)
LYMPH%: 23.3 % (ref 14.0–49.0)
MCHC: 33.3 g/dL (ref 32.0–36.0)
MCV: 94.6 fL (ref 79.3–98.0)
MONO%: 6.8 % (ref 0.0–14.0)
NEUT#: 7.3 10*3/uL — ABNORMAL HIGH (ref 1.5–6.5)
Platelets: 84 10*3/uL — ABNORMAL LOW (ref 140–400)
RBC: 4.24 10*6/uL (ref 4.20–5.82)

## 2011-07-10 MED ORDER — BORTEZOMIB CHEMO SQ INJECTION 3.5 MG (2.5MG/ML)
1.3000 mg/m2 | Freq: Once | INTRAMUSCULAR | Status: AC
  Administered 2011-07-10: 2.5 mg via SUBCUTANEOUS
  Filled 2011-07-10: qty 2.5

## 2011-07-10 MED ORDER — ONDANSETRON HCL 8 MG PO TABS: 8.0000 mg | ORAL_TABLET | Freq: Once | ORAL | Status: DC

## 2011-07-10 NOTE — Progress Notes (Signed)
Notified Dr. Cyndie Chime of patients decreased platelet count.  Given the okay to treat patient with SQ Velcade

## 2011-07-14 ENCOUNTER — Ambulatory Visit: Payer: No Typology Code available for payment source

## 2011-07-14 ENCOUNTER — Other Ambulatory Visit: Payer: No Typology Code available for payment source | Admitting: Lab

## 2011-07-14 ENCOUNTER — Other Ambulatory Visit: Payer: Self-pay

## 2011-07-14 MED ORDER — LENALIDOMIDE 25 MG PO CAPS: ORAL_CAPSULE | ORAL | Status: DC

## 2011-07-14 NOTE — Telephone Encounter (Signed)
Auth # for Revlimid - [id number]

## 2011-07-16 ENCOUNTER — Encounter: Payer: Self-pay | Admitting: *Deleted

## 2011-07-16 NOTE — Progress Notes (Signed)
Received fax confirmation from [facility] that revlimid rx was filled today & will be shipped to arrive at pt's home 07/17/11.

## 2011-07-17 ENCOUNTER — Other Ambulatory Visit: Payer: Self-pay | Admitting: Oncology

## 2011-07-17 ENCOUNTER — Ambulatory Visit: Payer: No Typology Code available for payment source

## 2011-07-17 ENCOUNTER — Other Ambulatory Visit: Payer: No Typology Code available for payment source | Admitting: Lab

## 2011-07-21 ENCOUNTER — Ambulatory Visit (HOSPITAL_BASED_OUTPATIENT_CLINIC_OR_DEPARTMENT_OTHER): Payer: No Typology Code available for payment source

## 2011-07-21 ENCOUNTER — Other Ambulatory Visit: Payer: Self-pay | Admitting: Oncology

## 2011-07-21 ENCOUNTER — Other Ambulatory Visit: Payer: Self-pay | Admitting: Nurse Practitioner

## 2011-07-21 ENCOUNTER — Other Ambulatory Visit (HOSPITAL_BASED_OUTPATIENT_CLINIC_OR_DEPARTMENT_OTHER): Payer: No Typology Code available for payment source | Admitting: Lab

## 2011-07-21 DIAGNOSIS — C9000 Multiple myeloma not having achieved remission: Secondary | ICD-10-CM

## 2011-07-21 DIAGNOSIS — M899 Disorder of bone, unspecified: Secondary | ICD-10-CM

## 2011-07-21 DIAGNOSIS — Z5112 Encounter for antineoplastic immunotherapy: Secondary | ICD-10-CM

## 2011-07-21 DIAGNOSIS — E8809 Other disorders of plasma-protein metabolism, not elsewhere classified: Secondary | ICD-10-CM

## 2011-07-21 LAB — CBC WITH DIFFERENTIAL/PLATELET
Basophils Absolute: 0 10*3/uL (ref 0.0–0.1)
Eosinophils Absolute: 0.1 10*3/uL (ref 0.0–0.5)
HGB: 14.1 g/dL (ref 13.0–17.1)
MONO#: 0.4 10*3/uL (ref 0.1–0.9)
NEUT#: 1.2 10*3/uL — ABNORMAL LOW (ref 1.5–6.5)
RDW: 14.2 % (ref 11.0–14.6)
WBC: 3.1 10*3/uL — ABNORMAL LOW (ref 4.0–10.3)
lymph#: 1.3 10*3/uL (ref 0.9–3.3)

## 2011-07-21 MED ORDER — SODIUM CHLORIDE 0.9 % IJ SOLN
10.0000 mL | INTRAMUSCULAR | Status: DC | PRN
  Filled 2011-07-21: qty 10

## 2011-07-21 MED ORDER — BORTEZOMIB CHEMO SQ INJECTION 3.5 MG (2.5MG/ML)
1.3000 mg/m2 | Freq: Once | INTRAMUSCULAR | Status: AC
  Administered 2011-07-21: 2.5 mg via SUBCUTANEOUS
  Filled 2011-07-21: qty 2.5

## 2011-07-21 MED ORDER — HEPARIN SOD (PORK) LOCK FLUSH 100 UNIT/ML IV SOLN
500.0000 [IU] | Freq: Once | INTRAVENOUS | Status: DC | PRN
  Filled 2011-07-21: qty 5

## 2011-07-21 MED ORDER — ZOLEDRONIC ACID 4 MG/100ML IV SOLN
4.0000 mg | Freq: Once | INTRAVENOUS | Status: AC
  Administered 2011-07-21: 4 mg via INTRAVENOUS
  Filled 2011-07-21: qty 100

## 2011-07-21 MED ORDER — SODIUM CHLORIDE 0.9 % IV SOLN
Freq: Once | INTRAVENOUS | Status: AC
  Administered 2011-07-21: 14:00:00 via INTRAVENOUS

## 2011-07-21 MED ORDER — ONDANSETRON HCL 8 MG PO TABS: 8.0000 mg | ORAL_TABLET | Freq: Once | ORAL | Status: DC

## 2011-07-21 NOTE — Patient Instructions (Signed)
Pt to call if any problems, aware of next appt.

## 2011-07-22 LAB — COMPREHENSIVE METABOLIC PANEL
Albumin: 4.6 g/dL (ref 3.5–5.2)
Alkaline Phosphatase: 124 U/L — ABNORMAL HIGH (ref 39–117)
BUN: 12 mg/dL (ref 6–23)
CO2: 26 mEq/L (ref 19–32)
Calcium: 8.7 mg/dL (ref 8.4–10.5)
Chloride: 106 mEq/L (ref 96–112)
Glucose, Bld: 108 mg/dL — ABNORMAL HIGH (ref 70–99)
Potassium: 4.3 mEq/L (ref 3.5–5.3)

## 2011-07-22 LAB — KAPPA/LAMBDA LIGHT CHAINS: Kappa free light chain: 65.4 mg/dL — ABNORMAL HIGH (ref 0.33–1.94)

## 2011-07-23 ENCOUNTER — Telehealth: Payer: Self-pay

## 2011-07-23 NOTE — Telephone Encounter (Signed)
Pt notified by Dr Cyndie Chime of lab results.  dph

## 2011-07-23 NOTE — Telephone Encounter (Signed)
Message copied by Albertha Ghee on Wed Jul 23, 2011  9:50 AM ------      Message from: [name]      Created: Tue Jul 22, 2011  7:44 PM       Call Dr Veneta Penton free light chain down from [phone] we are heading in right direction

## 2011-07-24 ENCOUNTER — Ambulatory Visit (HOSPITAL_BASED_OUTPATIENT_CLINIC_OR_DEPARTMENT_OTHER): Payer: No Typology Code available for payment source

## 2011-07-24 ENCOUNTER — Other Ambulatory Visit: Payer: No Typology Code available for payment source | Admitting: Lab

## 2011-07-24 DIAGNOSIS — C9000 Multiple myeloma not having achieved remission: Secondary | ICD-10-CM

## 2011-07-24 DIAGNOSIS — M899 Disorder of bone, unspecified: Secondary | ICD-10-CM

## 2011-07-24 DIAGNOSIS — M898X9 Other specified disorders of bone, unspecified site: Secondary | ICD-10-CM

## 2011-07-24 DIAGNOSIS — Z5112 Encounter for antineoplastic immunotherapy: Secondary | ICD-10-CM

## 2011-07-24 LAB — CBC WITH DIFFERENTIAL/PLATELET
BASO%: 0.6 % (ref 0.0–2.0)
Basophils Absolute: 0 10*3/uL (ref 0.0–0.1)
EOS%: 2.6 % (ref 0.0–7.0)
HCT: 40.3 % (ref 38.4–49.9)
HGB: 13.8 g/dL (ref 13.0–17.1)
LYMPH%: 17.8 % (ref 14.0–49.0)
MCH: 32.2 pg (ref 27.2–33.4)
MCHC: 34.2 g/dL (ref 32.0–36.0)
MCV: 94.1 fL (ref 79.3–98.0)
MONO%: 9.2 % (ref 0.0–14.0)
NEUT%: 69.8 % (ref 39.0–75.0)
lymph#: 0.7 10*3/uL — ABNORMAL LOW (ref 0.9–3.3)

## 2011-07-24 MED ORDER — BORTEZOMIB CHEMO SQ INJECTION 3.5 MG (2.5MG/ML)
1.3000 mg/m2 | Freq: Once | INTRAMUSCULAR | Status: AC
  Administered 2011-07-24: 2.5 mg via SUBCUTANEOUS
  Filled 2011-07-24: qty 2.5

## 2011-07-24 MED ORDER — ONDANSETRON HCL 8 MG PO TABS: 8.0000 mg | ORAL_TABLET | Freq: Once | ORAL | Status: DC

## 2011-07-24 NOTE — Patient Instructions (Signed)
Patient discharged to lab; tolerated well.

## 2011-07-28 ENCOUNTER — Other Ambulatory Visit (HOSPITAL_BASED_OUTPATIENT_CLINIC_OR_DEPARTMENT_OTHER): Payer: PRIVATE HEALTH INSURANCE | Admitting: Lab

## 2011-07-28 ENCOUNTER — Ambulatory Visit (HOSPITAL_BASED_OUTPATIENT_CLINIC_OR_DEPARTMENT_OTHER): Payer: PRIVATE HEALTH INSURANCE

## 2011-07-28 DIAGNOSIS — C9000 Multiple myeloma not having achieved remission: Secondary | ICD-10-CM

## 2011-07-28 DIAGNOSIS — M899 Disorder of bone, unspecified: Secondary | ICD-10-CM

## 2011-07-28 DIAGNOSIS — Z5112 Encounter for antineoplastic immunotherapy: Secondary | ICD-10-CM

## 2011-07-28 LAB — CBC WITH DIFFERENTIAL/PLATELET
BASO%: 0.7 % (ref 0.0–2.0)
Basophils Absolute: 0 10*3/uL (ref 0.0–0.1)
EOS%: 9 % — ABNORMAL HIGH (ref 0.0–7.0)
HGB: 14.1 g/dL (ref 13.0–17.1)
MCH: 32.4 pg (ref 27.2–33.4)
MCHC: 34.5 g/dL (ref 32.0–36.0)
MCV: 94 fL (ref 79.3–98.0)
MONO%: 17.1 % — ABNORMAL HIGH (ref 0.0–14.0)
RBC: 4.36 10*6/uL (ref 4.20–5.82)
RDW: 14.5 % (ref 11.0–14.6)
lymph#: 1.2 10*3/uL (ref 0.9–3.3)

## 2011-07-28 MED ORDER — BORTEZOMIB CHEMO SQ INJECTION 3.5 MG (2.5MG/ML)
1.3000 mg/m2 | Freq: Once | INTRAMUSCULAR | Status: AC
  Administered 2011-07-28: 2.5 mg via SUBCUTANEOUS
  Filled 2011-07-28: qty 2.5

## 2011-07-28 MED ORDER — ONDANSETRON HCL 8 MG PO TABS: 8.0000 mg | ORAL_TABLET | Freq: Once | ORAL | Status: DC

## 2011-07-31 ENCOUNTER — Ambulatory Visit (HOSPITAL_BASED_OUTPATIENT_CLINIC_OR_DEPARTMENT_OTHER): Payer: PRIVATE HEALTH INSURANCE

## 2011-07-31 ENCOUNTER — Other Ambulatory Visit: Payer: Self-pay | Admitting: Oncology

## 2011-07-31 ENCOUNTER — Telehealth: Payer: Self-pay | Admitting: Oncology

## 2011-07-31 DIAGNOSIS — C9000 Multiple myeloma not having achieved remission: Secondary | ICD-10-CM

## 2011-07-31 DIAGNOSIS — M899 Disorder of bone, unspecified: Secondary | ICD-10-CM

## 2011-07-31 DIAGNOSIS — Z5112 Encounter for antineoplastic immunotherapy: Secondary | ICD-10-CM

## 2011-07-31 MED ORDER — BORTEZOMIB CHEMO SQ INJECTION 3.5 MG (2.5MG/ML)
1.3000 mg/m2 | Freq: Once | INTRAMUSCULAR | Status: AC
  Administered 2011-07-31: 2.5 mg via SUBCUTANEOUS
  Filled 2011-07-31: qty 2.5

## 2011-07-31 MED ORDER — ONDANSETRON HCL 8 MG PO TABS: 8.0000 mg | ORAL_TABLET | Freq: Once | ORAL | Status: DC

## 2011-07-31 NOTE — Telephone Encounter (Signed)
This is insane [place]!!!!!  I just put in lab and MRI spine orders for 08/21/11; he already has an appt 11-11:45 AM for 09/02/11 I can't keep doing double and triple work  Dr Reece Agar

## 2011-08-01 NOTE — Telephone Encounter (Signed)
Look in appointments - it is there already

## 2011-08-04 ENCOUNTER — Telehealth: Payer: Self-pay

## 2011-08-04 NOTE — Telephone Encounter (Signed)
[facility] scheduled (per Dr Patsy Lager written order) for 08/26/11 with Pam in short stay.  Time & date confirmed with Claris Che in flow cytometry.  Orders faxed - [phone].  Pt notified by phone to arrive at [facility] admitting at 0700 for 0800 appt.  NPO after midnight & bring a driver.  dph

## 2011-08-07 ENCOUNTER — Telehealth: Payer: Self-pay | Admitting: Oncology

## 2011-08-07 NOTE — Telephone Encounter (Signed)
Called pt, left message reminded him of appt for 12/31 and also MRI appt. Advised pt to get calendar for January 2013 appts.

## 2011-08-08 ENCOUNTER — Other Ambulatory Visit: Payer: Self-pay | Admitting: *Deleted

## 2011-08-08 MED ORDER — LENALIDOMIDE 25 MG PO CAPS: ORAL_CAPSULE | ORAL | Status: DC

## 2011-08-11 ENCOUNTER — Ambulatory Visit (HOSPITAL_BASED_OUTPATIENT_CLINIC_OR_DEPARTMENT_OTHER): Payer: PRIVATE HEALTH INSURANCE

## 2011-08-11 ENCOUNTER — Other Ambulatory Visit (HOSPITAL_BASED_OUTPATIENT_CLINIC_OR_DEPARTMENT_OTHER): Payer: No Typology Code available for payment source | Admitting: Lab

## 2011-08-11 DIAGNOSIS — Z5112 Encounter for antineoplastic immunotherapy: Secondary | ICD-10-CM

## 2011-08-11 DIAGNOSIS — C9000 Multiple myeloma not having achieved remission: Secondary | ICD-10-CM

## 2011-08-11 DIAGNOSIS — M899 Disorder of bone, unspecified: Secondary | ICD-10-CM

## 2011-08-11 LAB — CBC WITH DIFFERENTIAL/PLATELET
Basophils Absolute: 0 10*3/uL (ref 0.0–0.1)
Eosinophils Absolute: 0 10*3/uL (ref 0.0–0.5)
HGB: 14.8 g/dL (ref 13.0–17.1)
MCV: 92.9 fL (ref 79.3–98.0)
MONO%: 17.6 % — ABNORMAL HIGH (ref 0.0–14.0)
NEUT#: 1.1 10*3/uL — ABNORMAL LOW (ref 1.5–6.5)
RBC: 4.61 10*6/uL (ref 4.20–5.82)
RDW: 14.4 % (ref 11.0–14.6)
WBC: 3 10*3/uL — ABNORMAL LOW (ref 4.0–10.3)
lymph#: 1.2 10*3/uL (ref 0.9–3.3)

## 2011-08-11 MED ORDER — ONDANSETRON HCL 8 MG PO TABS: 8.0000 mg | ORAL_TABLET | Freq: Once | ORAL | Status: DC

## 2011-08-11 MED ORDER — BORTEZOMIB CHEMO SQ INJECTION 3.5 MG (2.5MG/ML)
1.3000 mg/m2 | Freq: Once | INTRAMUSCULAR | Status: AC
  Administered 2011-08-11: 2.5 mg via SUBCUTANEOUS
  Filled 2011-08-11: qty 2.5

## 2011-08-11 NOTE — Progress Notes (Signed)
ANC  1.1 today.  Dr. Myna Hidalgo notified.  OK to treat as per md.

## 2011-08-11 NOTE — Patient Instructions (Signed)
Pt tolerated SQ Velcade without difficulty.   Pt has return appts.

## 2011-08-14 ENCOUNTER — Ambulatory Visit (HOSPITAL_BASED_OUTPATIENT_CLINIC_OR_DEPARTMENT_OTHER): Payer: PRIVATE HEALTH INSURANCE

## 2011-08-14 ENCOUNTER — Other Ambulatory Visit: Payer: Self-pay

## 2011-08-14 DIAGNOSIS — C9000 Multiple myeloma not having achieved remission: Secondary | ICD-10-CM

## 2011-08-14 DIAGNOSIS — M899 Disorder of bone, unspecified: Secondary | ICD-10-CM

## 2011-08-14 DIAGNOSIS — Z5112 Encounter for antineoplastic immunotherapy: Secondary | ICD-10-CM

## 2011-08-14 MED ORDER — BORTEZOMIB CHEMO SQ INJECTION 3.5 MG (2.5MG/ML)
1.3000 mg/m2 | Freq: Once | INTRAMUSCULAR | Status: AC
  Administered 2011-08-14: 2.5 mg via SUBCUTANEOUS
  Filled 2011-08-14: qty 2.5

## 2011-08-16 ENCOUNTER — Other Ambulatory Visit: Payer: Self-pay | Admitting: Oncology

## 2011-08-18 ENCOUNTER — Other Ambulatory Visit (HOSPITAL_BASED_OUTPATIENT_CLINIC_OR_DEPARTMENT_OTHER): Payer: PRIVATE HEALTH INSURANCE | Admitting: Lab

## 2011-08-18 ENCOUNTER — Ambulatory Visit (HOSPITAL_BASED_OUTPATIENT_CLINIC_OR_DEPARTMENT_OTHER): Payer: PRIVATE HEALTH INSURANCE

## 2011-08-18 ENCOUNTER — Encounter (HOSPITAL_COMMUNITY): Payer: Self-pay | Admitting: Pharmacy Technician

## 2011-08-18 DIAGNOSIS — M899 Disorder of bone, unspecified: Secondary | ICD-10-CM

## 2011-08-18 DIAGNOSIS — C9000 Multiple myeloma not having achieved remission: Secondary | ICD-10-CM

## 2011-08-18 DIAGNOSIS — Z5112 Encounter for antineoplastic immunotherapy: Secondary | ICD-10-CM

## 2011-08-18 LAB — CBC WITH DIFFERENTIAL/PLATELET
BASO%: 1.2 % (ref 0.0–2.0)
EOS%: 13.8 % — ABNORMAL HIGH (ref 0.0–7.0)
HCT: 41.7 % (ref 38.4–49.9)
LYMPH%: 24.3 % (ref 14.0–49.0)
MCH: 32 pg (ref 27.2–33.4)
MCHC: 34.6 g/dL (ref 32.0–36.0)
MONO%: 13.8 % (ref 0.0–14.0)
NEUT%: 46.9 % (ref 39.0–75.0)
Platelets: 136 10*3/uL — ABNORMAL LOW (ref 140–400)
RBC: 4.51 10*6/uL (ref 4.20–5.82)

## 2011-08-18 MED ORDER — BORTEZOMIB CHEMO SQ INJECTION 3.5 MG (2.5MG/ML)
1.3000 mg/m2 | Freq: Once | INTRAMUSCULAR | Status: AC
  Administered 2011-08-18: 2.5 mg via SUBCUTANEOUS
  Filled 2011-08-18: qty 2.5

## 2011-08-20 ENCOUNTER — Other Ambulatory Visit: Payer: Self-pay | Admitting: Oncology

## 2011-08-20 LAB — COMPREHENSIVE METABOLIC PANEL
ALT: 18 U/L (ref 0–53)
AST: 15 U/L (ref 0–37)
Albumin: 4.5 g/dL (ref 3.5–5.2)
BUN: 11 mg/dL (ref 6–23)
CO2: 29 mEq/L (ref 19–32)
Calcium: 8.2 mg/dL — ABNORMAL LOW (ref 8.4–10.5)
Chloride: 105 mEq/L (ref 96–112)
Potassium: 4.1 mEq/L (ref 3.5–5.3)

## 2011-08-20 LAB — KAPPA/LAMBDA LIGHT CHAINS
Kappa free light chain: 25.3 mg/dL — ABNORMAL HIGH (ref 0.33–1.94)
Kappa:Lambda Ratio: 126.5 — ABNORMAL HIGH (ref 0.26–1.65)

## 2011-08-20 LAB — IMMUNOFIXATION ELECTROPHORESIS
IgA: 8 mg/dL — ABNORMAL LOW (ref 68–379)
IgG (Immunoglobin G), Serum: 283 mg/dL — ABNORMAL LOW (ref 650–1600)
IgM, Serum: 6 mg/dL — ABNORMAL LOW (ref 41–251)
Total Protein, Serum Electrophoresis: 5.9 g/dL — ABNORMAL LOW (ref 6.0–8.3)

## 2011-08-20 LAB — BETA 2 MICROGLOBULIN, SERUM: Beta-2 Microglobulin: 2.23 mg/L — ABNORMAL HIGH (ref 1.01–1.73)

## 2011-08-21 ENCOUNTER — Telehealth: Payer: Self-pay | Admitting: Oncology

## 2011-08-21 ENCOUNTER — Ambulatory Visit (HOSPITAL_BASED_OUTPATIENT_CLINIC_OR_DEPARTMENT_OTHER): Payer: No Typology Code available for payment source

## 2011-08-21 ENCOUNTER — Other Ambulatory Visit: Payer: Self-pay | Admitting: Oncology

## 2011-08-21 DIAGNOSIS — C9000 Multiple myeloma not having achieved remission: Secondary | ICD-10-CM

## 2011-08-21 DIAGNOSIS — M949 Disorder of cartilage, unspecified: Secondary | ICD-10-CM

## 2011-08-21 DIAGNOSIS — M899 Disorder of bone, unspecified: Secondary | ICD-10-CM

## 2011-08-21 MED ORDER — BORTEZOMIB CHEMO SQ INJECTION 3.5 MG (2.5MG/ML)
1.3000 mg/m2 | Freq: Once | INTRAMUSCULAR | Status: AC
  Administered 2011-08-21: 2.5 mg via SUBCUTANEOUS
  Filled 2011-08-21: qty 2.5

## 2011-08-21 NOTE — Telephone Encounter (Signed)
Talked to pt, he wants to do Chest x-ray on the 1/18th together with MRI.

## 2011-08-22 ENCOUNTER — Other Ambulatory Visit: Payer: Self-pay | Admitting: Oncology

## 2011-08-26 ENCOUNTER — Encounter (HOSPITAL_COMMUNITY): Payer: Self-pay

## 2011-08-26 ENCOUNTER — Ambulatory Visit (HOSPITAL_COMMUNITY)
Admission: RE | Admit: 2011-08-26 | Discharge: 2011-08-26 | Disposition: A | Discharge status: Home / Self Care | Payer: No Typology Code available for payment source | Source: Ambulatory Visit | Attending: Oncology | Admitting: Oncology

## 2011-08-26 ENCOUNTER — Other Ambulatory Visit: Payer: Self-pay | Admitting: Oncology

## 2011-08-26 DIAGNOSIS — E8809 Other disorders of plasma-protein metabolism, not elsewhere classified: Secondary | ICD-10-CM | POA: Insufficient documentation

## 2011-08-26 DIAGNOSIS — C9000 Multiple myeloma not having achieved remission: Secondary | ICD-10-CM | POA: Insufficient documentation

## 2011-08-26 DIAGNOSIS — D704 Cyclic neutropenia: Secondary | ICD-10-CM | POA: Insufficient documentation

## 2011-08-26 DIAGNOSIS — D696 Thrombocytopenia, unspecified: Secondary | ICD-10-CM | POA: Insufficient documentation

## 2011-08-26 DIAGNOSIS — D72821 Monocytosis (symptomatic): Secondary | ICD-10-CM | POA: Insufficient documentation

## 2011-08-26 LAB — BASIC METABOLIC PANEL
BUN: 11 mg/dL (ref 6–23)
Chloride: 107 mEq/L (ref 96–112)
Creatinine, Ser: 1.06 mg/dL (ref 0.50–1.35)
GFR calc Af Amer: >90 mL/min (ref >90)
GFR calc non Af Amer: >90 mL/min (ref >90)
Potassium: 3.8 mEq/L (ref 3.5–5.1)

## 2011-08-26 LAB — DIFFERENTIAL
Basophils Absolute: 0 10*3/uL (ref 0.0–0.1)
Basophils Relative: 1 % (ref 0–1)
Eosinophils Absolute: 0.4 10*3/uL (ref 0.0–0.7)
Monocytes Absolute: 1 10*3/uL (ref 0.1–1.0)
Neutro Abs: 1.7 10*3/uL (ref 1.7–7.7)
Neutrophils Relative %: 39 % — ABNORMAL LOW (ref 43–77)

## 2011-08-26 LAB — CBC
HCT: 40.2 % (ref 39.0–52.0)
MCH: 30.9 pg (ref 26.0–34.0)
MCV: 89.3 fL (ref 78.0–100.0)
RBC: 4.5 MIL/uL (ref 4.22–5.81)
WBC: 4.3 10*3/uL (ref 4.0–10.5)

## 2011-08-26 MED ORDER — MIDAZOLAM HCL 10 MG/2ML IJ SOLN
INTRAMUSCULAR | Status: AC
  Administered 2011-08-26: 2.5 mg via INTRAVENOUS
  Filled 2011-08-26: qty 2

## 2011-08-26 MED ORDER — SODIUM CHLORIDE 0.9 % IV SOLN
INTRAVENOUS | Status: DC
  Administered 2011-08-26: 250 mL via INTRAVENOUS

## 2011-08-26 MED ORDER — MIDAZOLAM HCL 2 MG/2ML IJ SOLN: 10.0000 mg | Freq: Once | INTRAMUSCULAR | Status: AC

## 2011-08-26 NOTE — Procedures (Signed)
Left posterior iliac crest bone marrow aspiration and biopsy X 2 done under 2% lidocaine local anesthesia with 5 mg IV Versed pre-medication without complication. ASA 1  Airway status 1 Indication:  Assess results of 4 cycles of Revlimid/Velcade/Dexsamethasone on kappa light chain multiple myeloma.

## 2011-08-27 ENCOUNTER — Ambulatory Visit (INDEPENDENT_AMBULATORY_CARE_PROVIDER_SITE_OTHER)
Admission: RE | Admit: 2011-08-27 | Discharge: 2011-08-27 | Disposition: A | Discharge status: Home / Self Care | Payer: PRIVATE HEALTH INSURANCE | Source: Ambulatory Visit | Attending: Oncology | Admitting: Oncology

## 2011-08-27 ENCOUNTER — Ambulatory Visit (HOSPITAL_BASED_OUTPATIENT_CLINIC_OR_DEPARTMENT_OTHER)
Admission: RE | Admit: 2011-08-27 | Discharge: 2011-08-27 | Disposition: A | Discharge status: Home / Self Care | Payer: PRIVATE HEALTH INSURANCE | Source: Ambulatory Visit | Attending: Oncology | Admitting: Oncology

## 2011-08-27 DIAGNOSIS — Z01818 Encounter for other preprocedural examination: Secondary | ICD-10-CM

## 2011-08-27 DIAGNOSIS — M899 Disorder of bone, unspecified: Secondary | ICD-10-CM | POA: Insufficient documentation

## 2011-08-27 DIAGNOSIS — M8448XA Pathological fracture, other site, initial encounter for fracture: Secondary | ICD-10-CM

## 2011-08-27 DIAGNOSIS — C9000 Multiple myeloma not having achieved remission: Secondary | ICD-10-CM

## 2011-08-28 ENCOUNTER — Telehealth: Payer: Self-pay | Admitting: *Deleted

## 2011-08-28 LAB — UIFE/LIGHT CHAINS/TP QN, 24-HR UR
Albumin, U: DETECTED
Alpha 1, Urine: DETECTED — AB
Free Kappa/Lambda Ratio: 31.5 ratio — ABNORMAL HIGH (ref 2.04–10.37)
Free Lambda Excretion/Day: 0.96 mg/d
Free Lambda Lt Chains,Ur: 0.04 mg/dL (ref 0.02–0.67)
Gamma Globulin, Urine: DETECTED — AB
Time: 24 hours
Total Protein, Urine-Ur/day: 46 mg/d (ref 10–140)
Volume, Urine: 2400 mL

## 2011-08-28 LAB — CREATININE CLEARANCE, URINE, 24 HOUR
Creatinine, Urine: 85.7 mg/dL
Creatinine: 1.09 mg/dL (ref 0.50–1.35)

## 2011-08-28 NOTE — Telephone Encounter (Signed)
Most recent labs & BMBX report faxed to Dr. Greggory Stallion per Dr. Patsy Lager request for appt. there today.

## 2011-08-29 ENCOUNTER — Other Ambulatory Visit (HOSPITAL_COMMUNITY): Payer: No Typology Code available for payment source

## 2011-08-29 ENCOUNTER — Ambulatory Visit (HOSPITAL_COMMUNITY)
Admission: RE | Admit: 2011-08-29 | Discharge: 2011-08-29 | Disposition: A | Discharge status: Home / Self Care | Payer: PRIVATE HEALTH INSURANCE | Source: Ambulatory Visit | Attending: Oncology | Admitting: Oncology

## 2011-08-29 ENCOUNTER — Other Ambulatory Visit (HOSPITAL_BASED_OUTPATIENT_CLINIC_OR_DEPARTMENT_OTHER): Payer: No Typology Code available for payment source

## 2011-08-29 DIAGNOSIS — M5030 Other cervical disc degeneration, unspecified cervical region: Secondary | ICD-10-CM | POA: Insufficient documentation

## 2011-08-29 DIAGNOSIS — M8448XA Pathological fracture, other site, initial encounter for fracture: Secondary | ICD-10-CM | POA: Insufficient documentation

## 2011-08-29 DIAGNOSIS — M899 Disorder of bone, unspecified: Secondary | ICD-10-CM

## 2011-08-29 DIAGNOSIS — C9000 Multiple myeloma not having achieved remission: Secondary | ICD-10-CM | POA: Insufficient documentation

## 2011-08-29 MED ORDER — GADOBENATE DIMEGLUMINE 529 MG/ML IV SOLN
15.0000 mL | Freq: Once | INTRAVENOUS | Status: AC | PRN
  Administered 2011-08-29: 15 mL via INTRAVENOUS

## 2011-09-01 ENCOUNTER — Other Ambulatory Visit: Payer: Self-pay | Admitting: Oncology

## 2011-09-01 DIAGNOSIS — M899 Disorder of bone, unspecified: Secondary | ICD-10-CM

## 2011-09-02 ENCOUNTER — Other Ambulatory Visit: Payer: No Typology Code available for payment source | Admitting: Lab

## 2011-09-02 ENCOUNTER — Ambulatory Visit (HOSPITAL_BASED_OUTPATIENT_CLINIC_OR_DEPARTMENT_OTHER): Payer: PRIVATE HEALTH INSURANCE | Admitting: Oncology

## 2011-09-02 ENCOUNTER — Ambulatory Visit: Payer: No Typology Code available for payment source

## 2011-09-02 ENCOUNTER — Telehealth: Payer: Self-pay

## 2011-09-02 VITALS — BP 129/76 | HR 78 | Temp 97.9°F | Ht 67.0 in | Wt 164.2 lb

## 2011-09-02 DIAGNOSIS — C9000 Multiple myeloma not having achieved remission: Secondary | ICD-10-CM

## 2011-09-02 NOTE — Progress Notes (Signed)
Hematology and Oncology Follow Up Visit  Ralph Barnett [id number] 24-Feb-1976 36 y.o. 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 Senaida Ores at the [facility]. He is tolerated treatments well. He is also receiving monthly Zometa. We held the dose last week due to hypocalcemia. He was just restaged with a bone marrow biopsy, skeletal survey, lab work, an MRI of his spine. Bone marrow biopsy done by myself on January 15 showed a significant reduction in the number of plasma cells down from a pretreatment value of 42% plasma cells on 05/27/2011 to current value of 12%. Serum kappa free light chains were 10 mg percent at diagnosis and I believe that we will just seeing rapidly growing disease not yet controlled on treatment since the very next value was 300 mg percent. There is now been a steady downward trend with most recent value down to 25 mg percent on 08/18/2011. Repeat 24-hour urine done on January 15 shows only a total of 24 mg of protein and no monoclonal protein on IFE. Serum total immunoglobulins were also present at time of diagnosis and remain so. IgG 283 mg percent IgA 8 mg percent I do not see result for IgM. Radiographic studies showed diffuse lytic metastases. No evidence for any cord compromise. There has been a endplate compression of L3 which has progressed compared with the pretreatment study.  He reports no new symptoms. If anything bone pain has decreased and he has no new areas of pain. He has had no interim fever or infection. He did have some mild paresthesias after his last cycle which have not completely resolved.  Medications: reviewed  Allergies: No Known Allergies  Review of Systems: Constitutional:  None  Respiratory: He denies any dyspnea or  cough Cardiovascular:  No chest pain or pressure Gastrointestinal: No abdominal pain. Intermittent constipation from the Revlimid relieved by laxative Genito-Urinary: No bladder or bowel dysfunction Musculoskeletal: No new areas of bone pain Neurologic: No focal weakness Skin: No rash Remaining ROS negative.  Physical Exam: Blood pressure 129/76, pulse 78, temperature 97.9 F (36.6 C), temperature source Oral, height 5\' 7"  (1.702 m), weight 164 lb 3.2 oz (74.481 kg). Wt Readings from Last 3 Encounters:  09/02/11 164 lb 3.2 oz (74.481 kg)  08/26/11 160 lb (72.576 kg)  06/17/11 158 lb 1.6 oz (71.714 kg)     General appearance: He has shaved his head in anticipation of upcoming chemotherapy HENNT: Pharynx no erythema or exit Lymph nodes: No adenopathy Breasts: Lungs: Clear to auscultation resonant to percussion Heart: Regular cardiac rhythm no Abdomen: Soft nontender no mass no Extremities: No edema no calf tenderness Vascular: No cyanosis Neurologic: Motor strength 5 over 5 reflexes 2+ symmetric Skin: No rash no ecchymoses  Lab Results: Lab Results  Component Value Date   WBC 4.3 08/26/2011   HGB 13.9 08/26/2011   HCT 40.2 08/26/2011   MCV 89.3 08/26/2011   PLT 125* 08/26/2011     Chemistry      Component Value Date/Time   NA 140 08/26/2011 0720   K 3.8 08/26/2011 0720   CL 107 08/26/2011 0720   CO2 26 08/26/2011 0720   BUN 11 08/26/2011 0720   CREATININE 1.09 08/26/2011 1019   CREATININE 1.06 08/26/2011 0720      Component Value Date/Time   CALCIUM  8.8 08/26/2011 0720   CALCIUM 10.6* 05/15/2011 1547   ALKPHOS 91 08/18/2011 1426   AST 15 08/18/2011 1426   ALT 18 08/18/2011 1426   BILITOT 0.5 08/18/2011 1426       Radiological Studies: See discussion above   Impression and Plan: Kappa light chain multiple myeloma An excellent partial response to initial Revlimid Velcade dexamethasone induction. He is underactive evaluation at [facility] by Dr. Greggory Stallion and his team  his case with Dr. Greggory Stallion this morning. All discontinue the Revlimid Velcade dexamethasone at this time her CT received intravenous Cytoxan and stem cell collection within the next week or 2. Recovery, he will then receive intravenous melphalan. Subsequent to this, he will likely have a consolidation phase and then a maintenance phase to be determined.   CC:.    [name], MD 1/22/201311:42 AM

## 2011-09-02 NOTE — Telephone Encounter (Signed)
Received call from Clydie Braun at [facility] requesting records on pt.  Records had been faxed on 1/17 to Dr Greggory Stallion at [phone].  Clydie Braun requests records be faxed directly to her at [phone]. Fax complete & confirmation received. dph

## 2011-09-05 ENCOUNTER — Ambulatory Visit: Payer: No Typology Code available for payment source

## 2011-09-10 ENCOUNTER — Other Ambulatory Visit: Payer: Self-pay | Admitting: *Deleted

## 2011-09-10 ENCOUNTER — Telehealth: Payer: Self-pay | Admitting: *Deleted

## 2011-09-10 NOTE — Telephone Encounter (Signed)
Received call from Kendal Hymen [facility] [phone] requesting labs for pt MWF.  He needs cbc/diff on these days & BMP with Mg on Mon. Order to scheduler to call Kendal Hymen with times.  Verified with Kendal Hymen, dates are for 2/4/,2/6 & 2/8.

## 2011-09-12 ENCOUNTER — Other Ambulatory Visit: Payer: No Typology Code available for payment source | Admitting: Lab

## 2011-09-15 ENCOUNTER — Ambulatory Visit: Payer: No Typology Code available for payment source

## 2011-09-15 ENCOUNTER — Other Ambulatory Visit: Payer: No Typology Code available for payment source | Admitting: Lab

## 2011-09-15 ENCOUNTER — Other Ambulatory Visit: Payer: PRIVATE HEALTH INSURANCE | Admitting: Lab

## 2011-09-15 ENCOUNTER — Ambulatory Visit (HOSPITAL_BASED_OUTPATIENT_CLINIC_OR_DEPARTMENT_OTHER): Payer: PRIVATE HEALTH INSURANCE

## 2011-09-15 DIAGNOSIS — C9000 Multiple myeloma not having achieved remission: Secondary | ICD-10-CM

## 2011-09-15 LAB — BASIC METABOLIC PANEL
CO2: 25 mEq/L (ref 19–32)
Calcium: 9 mg/dL (ref 8.4–10.5)
Chloride: 108 mEq/L (ref 96–112)
Glucose, Bld: 116 mg/dL — ABNORMAL HIGH (ref 70–99)
Potassium: 4 mEq/L (ref 3.5–5.3)
Sodium: 141 mEq/L (ref 135–145)

## 2011-09-15 LAB — CBC WITH DIFFERENTIAL/PLATELET
BASO%: 0 % (ref 0.0–2.0)
Eosinophils Absolute: 0.1 10*3/uL (ref 0.0–0.5)
LYMPH%: 5.4 % — ABNORMAL LOW (ref 14.0–49.0)
MCHC: 35.1 g/dL (ref 32.0–36.0)
MONO#: 0.1 10*3/uL (ref 0.1–0.9)
NEUT#: 2.5 10*3/uL (ref 1.5–6.5)
RBC: 4.26 10*6/uL (ref 4.20–5.82)
RDW: 14.1 % (ref 11.0–14.6)
WBC: 2.8 10*3/uL — ABNORMAL LOW (ref 4.0–10.3)
lymph#: 0.2 10*3/uL — ABNORMAL LOW (ref 0.9–3.3)

## 2011-09-15 MED ORDER — SODIUM CHLORIDE 0.9 % IJ SOLN
10.0000 mL | INTRAMUSCULAR | Status: DC | PRN
  Administered 2011-09-15: 10 mL via INTRAVENOUS
  Filled 2011-09-15: qty 10

## 2011-09-15 MED ORDER — HEPARIN SOD (PORK) LOCK FLUSH 100 UNIT/ML IV SOLN
500.0000 [IU] | Freq: Once | INTRAVENOUS | Status: AC
  Administered 2011-09-15: 500 [IU] via INTRAVENOUS
  Filled 2011-09-15: qty 5

## 2011-09-17 ENCOUNTER — Telehealth: Payer: Self-pay | Admitting: *Deleted

## 2011-09-17 ENCOUNTER — Other Ambulatory Visit (HOSPITAL_BASED_OUTPATIENT_CLINIC_OR_DEPARTMENT_OTHER): Payer: PRIVATE HEALTH INSURANCE | Admitting: Lab

## 2011-09-17 ENCOUNTER — Encounter: Payer: Self-pay | Admitting: *Deleted

## 2011-09-17 ENCOUNTER — Ambulatory Visit: Payer: PRIVATE HEALTH INSURANCE

## 2011-09-17 ENCOUNTER — Other Ambulatory Visit: Payer: Self-pay | Admitting: *Deleted

## 2011-09-17 DIAGNOSIS — C9000 Multiple myeloma not having achieved remission: Secondary | ICD-10-CM

## 2011-09-17 LAB — CBC WITH DIFFERENTIAL/PLATELET
BASO%: 0 % (ref 0.0–2.0)
Basophils Absolute: 0 10*3/uL (ref 0.0–0.1)
EOS%: 33.7 % — ABNORMAL HIGH (ref 0.0–7.0)
HGB: 13.8 g/dL (ref 13.0–17.1)
MCH: 31.4 pg (ref 27.2–33.4)
MCHC: 34.6 g/dL (ref 32.0–36.0)
RDW: 13.8 % (ref 11.0–14.6)
WBC: 0.3 10*3/uL — CL (ref 4.0–10.3)
lymph#: 0.2 10*3/uL — ABNORMAL LOW (ref 0.9–3.3)

## 2011-09-17 MED ORDER — HEPARIN SOD (PORK) LOCK FLUSH 100 UNIT/ML IV SOLN
500.0000 [IU] | Freq: Once | INTRAVENOUS | Status: AC
  Administered 2011-09-17: 500 [IU] via INTRAVENOUS
  Filled 2011-09-17: qty 5

## 2011-09-17 MED ORDER — SODIUM CHLORIDE 0.9 % IJ SOLN
10.0000 mL | INTRAMUSCULAR | Status: DC | PRN
  Administered 2011-09-17: 10 mL via INTRAVENOUS
  Filled 2011-09-17: qty 10

## 2011-09-17 NOTE — Telephone Encounter (Signed)
Labs from today faxed to [facility] @ [phone] & pt called.  He reports that he saw his labs & expected WBC to bottom out today.  He is on levoquin & is monitoring for fevers.

## 2011-09-19 ENCOUNTER — Ambulatory Visit: Payer: PRIVATE HEALTH INSURANCE

## 2011-09-19 ENCOUNTER — Other Ambulatory Visit (HOSPITAL_BASED_OUTPATIENT_CLINIC_OR_DEPARTMENT_OTHER): Payer: PRIVATE HEALTH INSURANCE | Admitting: Lab

## 2011-09-19 ENCOUNTER — Telehealth: Payer: Self-pay | Admitting: *Deleted

## 2011-09-19 DIAGNOSIS — C9000 Multiple myeloma not having achieved remission: Secondary | ICD-10-CM

## 2011-09-19 LAB — CBC WITH DIFFERENTIAL/PLATELET
Basophils Absolute: 0 10*3/uL (ref 0.0–0.1)
Eosinophils Absolute: 0.1 10*3/uL (ref 0.0–0.5)
HCT: 36.8 % — ABNORMAL LOW (ref 38.4–49.9)
HGB: 13 g/dL (ref 13.0–17.1)
MCV: 88.9 fL (ref 79.3–98.0)
MONO%: 36.2 % — ABNORMAL HIGH (ref 0.0–14.0)
NEUT#: 0 10*3/uL — CL (ref 1.5–6.5)
NEUT%: 1.8 % — ABNORMAL LOW (ref 39.0–75.0)
RDW: 13.7 % (ref 11.0–14.6)

## 2011-09-19 NOTE — Telephone Encounter (Signed)
Received results of CBC done today & faxed to Bonnie/Dr. Hurd @ [phone].

## 2011-09-29 ENCOUNTER — Ambulatory Visit (HOSPITAL_BASED_OUTPATIENT_CLINIC_OR_DEPARTMENT_OTHER): Payer: No Typology Code available for payment source | Admitting: Oncology

## 2011-09-29 ENCOUNTER — Ambulatory Visit: Payer: No Typology Code available for payment source

## 2011-09-29 ENCOUNTER — Telehealth: Payer: Self-pay | Admitting: Oncology

## 2011-09-29 ENCOUNTER — Other Ambulatory Visit: Payer: No Typology Code available for payment source | Admitting: Lab

## 2011-09-29 DIAGNOSIS — C9000 Multiple myeloma not having achieved remission: Secondary | ICD-10-CM

## 2011-09-29 LAB — CBC WITH DIFFERENTIAL/PLATELET
BASO%: 0.4 % (ref 0.0–2.0)
Basophils Absolute: 0 10*3/uL (ref 0.0–0.1)
EOS%: 1.1 % (ref 0.0–7.0)
Eosinophils Absolute: 0 10*3/uL (ref 0.0–0.5)
HGB: 13 g/dL (ref 13.0–17.1)
LYMPH%: 22.1 % (ref 14.0–49.0)
MONO#: 0.9 10*3/uL (ref 0.1–0.9)
MONO%: 33.6 % — ABNORMAL HIGH (ref 0.0–14.0)
NEUT#: 1.1 10*3/uL — ABNORMAL LOW (ref 1.5–6.5)
RDW: 13.5 % (ref 11.0–14.6)
lymph#: 0.6 10*3/uL — ABNORMAL LOW (ref 0.9–3.3)

## 2011-09-29 MED ORDER — SODIUM CHLORIDE 0.9 % IJ SOLN
10.0000 mL | INTRAMUSCULAR | Status: DC | PRN
  Administered 2011-09-29: 10 mL via INTRAVENOUS
  Filled 2011-09-29: qty 10

## 2011-09-29 MED ORDER — HEPARIN SOD (PORK) LOCK FLUSH 100 UNIT/ML IV SOLN
500.0000 [IU] | Freq: Once | INTRAVENOUS | Status: AC
  Administered 2011-09-29: 500 [IU] via INTRAVENOUS
  Filled 2011-09-29: qty 5

## 2011-09-29 NOTE — Telephone Encounter (Signed)
Talked to pt, gave him appt date for 10/27/11 MD visit only

## 2011-09-29 NOTE — Progress Notes (Signed)
Hematology and Oncology Follow Up Visit  Ralph Barnett [id number] 11-11-1975 36 y.o. 09/29/2011 11:50 AM   Principle Diagnosis: Encounter Diagnosis  Name Primary?  . Multiple myeloma Yes     Interim History:   Ralph Barnett  received high-dose intravenous Cytoxan at [facility] 2 weeks ago and then stem cell harvest. He tells me they were able to collect 32 million cells. He will going this Wednesday to receive high-dose IV melphalan and then stem cell support. Nadir counts after the Cytoxan were quite low as anticipated with a total white count down to 0.3 platelets to 59,000. He was put on prophylactic Levaquin unfortunately did not get any infection.  Medications: reviewed  Allergies: No Known Allergies  Review of Systems: Constitutional:   Performance status remains 0 Respiratory: No cough or dyspnea Cardiovascular:  No chest discomfort Gastrointestinal: No abdominal symptoms Genito-Urinary: Not questioned Musculoskeletal: No bone pain Neurologic: Skin: Remaining ROS negative.  Physical Exam: Height 5\' 7"  (1.702 m), weight 158 lb 11.2 oz (71.986 kg). Wt Readings from Last 3 Encounters:  09/29/11 158 lb 11.2 oz (71.986 kg)  09/02/11 164 lb 3.2 oz (74.481 kg)  08/26/11 160 lb (72.576 kg)     General appearance: Now with total alopecia HENNT: Pharynx no erythema exudate or ulcer Lymph nodes: No adenopathy Breasts: Lungs: Clear to auscultation resonant to percussion Heart: Regular cardiac rhythm no murmur Abdomen: Soft nontender Extremities: No edema no calf tenderness Vascular: No cyanosis Neurologic: Motor strength 5 over 5 reflexes 2+ symmetric sensation intact to vibration by tuning fork exam over the fingertips Skin: No rash or ecchymosis  Lab Results: Lab Results  Component Value Date   WBC 2.6* 09/29/2011   HGB 13.0 09/29/2011   HCT 37.0* 09/29/2011   MCV 86.4 09/29/2011   PLT 158 09/29/2011     Chemistry      Component Value Date/Time   NA 141  09/15/2011 1410   K 4.0 09/15/2011 1410   CL 108 09/15/2011 1410   CO2 25 09/15/2011 1410   BUN 14 09/15/2011 1410   CREATININE 0.86 09/15/2011 1410   CREATININE 1.09 08/26/2011 1019      Component Value Date/Time   CALCIUM 9.0 09/15/2011 1410   CALCIUM 10.6* 05/15/2011 1547   ALKPHOS 91 08/18/2011 1426   AST 15 08/18/2011 1426   ALT 18 08/18/2011 1426   BILITOT 0.5 08/18/2011 1426       Impression and Plan: Kappa light chain multiple myeloma with extensive bone and marrow involvement status post RVD induction x4 cycles status post Cytoxan consolidation and plan now to proceed with high-dose IV melphalan with stem cell support.  CC:. Dr. Smitty Cords swords; Dr. Marlaine Hind   [name], MD 2/18/201311:50 AM

## 2011-09-30 LAB — COMPREHENSIVE METABOLIC PANEL WITH GFR
ALT: 23 U/L (ref 0–53)
AST: 28 U/L (ref 0–37)
Albumin: 4.3 g/dL (ref 3.5–5.2)
Alkaline Phosphatase: 93 U/L (ref 39–117)
BUN: 9 mg/dL (ref 6–23)
CO2: 25 meq/L (ref 19–32)
Calcium: 8.6 mg/dL (ref 8.4–10.5)
Chloride: 107 meq/L (ref 96–112)
Creatinine, Ser: 1 mg/dL (ref 0.50–1.35)
Glucose, Bld: 97 mg/dL (ref 70–99)
Potassium: 4 meq/L (ref 3.5–5.3)
Sodium: 143 meq/L (ref 135–145)
Total Bilirubin: 0.3 mg/dL (ref 0.3–1.2)
Total Protein: 5.8 g/dL — ABNORMAL LOW (ref 6.0–8.3)

## 2011-09-30 LAB — KAPPA/LAMBDA LIGHT CHAINS: Kappa free light chain: 5.69 mg/dL — ABNORMAL HIGH (ref 0.33–1.94)

## 2011-10-17 ENCOUNTER — Other Ambulatory Visit: Payer: Self-pay | Admitting: *Deleted

## 2011-10-19 ENCOUNTER — Other Ambulatory Visit: Payer: Self-pay | Admitting: Oncology

## 2011-10-20 ENCOUNTER — Telehealth: Payer: Self-pay | Admitting: Oncology

## 2011-10-20 ENCOUNTER — Other Ambulatory Visit: Payer: Self-pay

## 2011-10-20 ENCOUNTER — Telehealth: Payer: Self-pay

## 2011-10-20 NOTE — Telephone Encounter (Signed)
Received call from [facility], scheduling, stating that when she called pt re: appt 3/13, per Dr. Cyndie Chime, pt stated that he was seen at [facility] today, and had labs, and does not want to come to office 10/22/11, wants to keep appt on 10/27/11.  This information will be relayed to Dr. Cyndie Chime for further review.

## 2011-10-20 NOTE — Telephone Encounter (Signed)
Talked to pt, he informed me that he just saw MD @ [facility] and does not want to come in to see MD on 3/13, he wants to wait till 10/27/11, he said he is doing fine.Informed Tiffany, Dr. Timoteo Expose nurse today, she will inform MD

## 2011-10-27 ENCOUNTER — Other Ambulatory Visit (HOSPITAL_BASED_OUTPATIENT_CLINIC_OR_DEPARTMENT_OTHER): Payer: PRIVATE HEALTH INSURANCE | Admitting: Lab

## 2011-10-27 ENCOUNTER — Ambulatory Visit: Payer: Self-pay | Admitting: Lab

## 2011-10-27 ENCOUNTER — Ambulatory Visit (HOSPITAL_BASED_OUTPATIENT_CLINIC_OR_DEPARTMENT_OTHER): Payer: PRIVATE HEALTH INSURANCE | Admitting: Oncology

## 2011-10-27 DIAGNOSIS — M899 Disorder of bone, unspecified: Secondary | ICD-10-CM

## 2011-10-27 DIAGNOSIS — C9000 Multiple myeloma not having achieved remission: Secondary | ICD-10-CM

## 2011-10-27 DIAGNOSIS — B259 Cytomegaloviral disease, unspecified: Secondary | ICD-10-CM

## 2011-10-27 LAB — CBC WITH DIFFERENTIAL/PLATELET
Basophils Absolute: 0 10*3/uL (ref 0.0–0.1)
Eosinophils Absolute: 0 10*3/uL (ref 0.0–0.5)
HCT: 39.6 % (ref 38.4–49.9)
HGB: 13.6 g/dL (ref 13.0–17.1)
LYMPH%: 61.1 % — ABNORMAL HIGH (ref 14.0–49.0)
MCV: 92.1 fL (ref 79.3–98.0)
MONO#: 0.3 10*3/uL (ref 0.1–0.9)
NEUT#: 0.8 10*3/uL — ABNORMAL LOW (ref 1.5–6.5)
NEUT%: 28.1 % — ABNORMAL LOW (ref 39.0–75.0)
Platelets: 163 10*3/uL (ref 140–400)
WBC: 2.9 10*3/uL — ABNORMAL LOW (ref 4.0–10.3)

## 2011-10-27 NOTE — Progress Notes (Signed)
Hematology and Oncology Follow Up Visit  Ralph Barnett [id number] [birth year] 36 y.o. 10/27/2011 11:57 AM   Principle Diagnosis: Encounter Diagnosis  Name Primary?  . 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 At [facility] in [place]. He developed the expected pancytopenia and then neutropenic fever  treated with antibiotics. He developed a cough. There were no obvious infiltrates on a regular chest radiograph however a CT scan of the chest showed bilateral pulmonary infiltrates. He underwent bronchoscopy with bronchial alveolar lavage on or around March 4. He was covered with antiviral and antifungal drugs  pending results. He was found to have a positive PCR for CMV virus and started on parenteral ganciclovir. He was given an infusion of intravenous immunoglobulin. He improved clinically and had recovery of his counts are 11 days post transplant March 5 total white count 3900 with neutrophils 2000. He was discharged on March 8. He was changed to oral ganciclovir. He was seen back a few days after discharge by the [facility] team. Repeat PCR for CMV showed CMV levels now undetectable.  He has had an intermittent cough but no dyspnea. No fever. White count has drifted down off Neupogen support. He reports some mild paresthesias in his fingers. He has developed a localized area of paresthesias over the right  triceps area. Vascular catheter has been removed and the right pectoral area has completely healed.  Medications: reviewed  Allergies: No Known Allergies  Review of Systems: Constitutional:   No constitutional symptoms Respiratory: See above Cardiovascular:   Gastrointestinal: Genito-Urinary:  Musculoskeletal: Neurologic: See above Skin: Remaining ROS negative.  Physical Exam: Blood pressure  111/76, pulse 101, temperature 98.6 F (37 C), temperature source Oral, height 5\' 7"  (1.702 m), weight 149 lb 11.2 oz (67.903 kg), SpO2 97.00%. Wt Readings from Last 3 Encounters:  10/27/11 149 lb 11.2 oz (67.903 kg)  09/29/11 158 lb 11.2 oz (71.986 kg)  09/02/11 164 lb 3.2 oz (74.481 kg)     General appearance: There is now total alopecia HENNT: Pharynx no erythema exudate or ulcer Lymph nodes: No cervical supraclavicular or axillary adenopathy Breasts: Lungs: Clear to auscultation resonant to percussion Heart: Regular rhythm no murmur Abdomen: Soft nontender no mass no organomegaly Extremities: No edema no calf tenderness Vascular: No cyanosis Neurologic: Motor strength is 5 over 5, reflexes 1+ symmetric, sensation is intact to vibration over the fingertips by tuning fork exam Skin: No rash or ecchymosis  Lab Results: Lab Results  Component Value Date   WBC 2.9* 10/27/2011   HGB 13.6 10/27/2011   HCT 39.6 10/27/2011   MCV 92.1 10/27/2011   PLT 163 10/27/2011  ANC 0.8   Chemistry      Component Value Date/Time   NA 143 09/29/2011 0957   K 4.0 09/29/2011 0957   CL 107 09/29/2011 0957   CO2 25 09/29/2011 0957   BUN 9 09/29/2011 0957   CREATININE 1.00 09/29/2011 0957   CREATININE 1.09 08/26/2011 1019      Component Value Date/Time   CALCIUM 8.6 09/29/2011 0957   CALCIUM 10.6* 05/15/2011 1547   ALKPHOS 93 09/29/2011 0957   AST 28 09/29/2011 0957   ALT 23 09/29/2011 0957   BILITOT 0.3 09/29/2011 0957       Impression and Plan: Kappa light chain myeloma with extensive bone disease presenting initially  with unexplained pleuritic rib pain in October 2012. Normal total serum immunoglobulins. No proteinuria. Peak kappa light chain in the serum 301 mg percent treated with induction RVD x4 months with fall in the kappa light chains to 5.7 mg percent by 09/29/2011. Initial bone marrow not cover 10 2012 with 42% plasma cells which likely underestimated the review of the marrow which showed large  aggregates and sheets of plasma cells. 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 Bone marrow following RVD done 08/26/2011 show plasma cells down to 12%. Resolution of abnormal cytogenetic changes. He is now status post Cytoxan induction, followed by IV melphalan with stem cell support. He'll have a reevaluation bone marrow biopsy at [facility] 60 days post transplant on April 24. He will likely go on a maintenance program at that time. He is enrolled in a clinical trial looking at tandem transplants and will be randomized at time of reevaluation in April. Needless to say, he is not enthusiastic about having to have another dose of IV melphalan.  #2. CMV pneumonia. Clinically improving with recovery of blood counts and on ganciclovir. We will repeat a PCR for CMV again today. C continue the ganciclovir as well as his prophylactic Septra.   CC:.    [name], MD 3/18/201311:57 AM

## 2011-10-28 ENCOUNTER — Telehealth: Payer: Self-pay

## 2011-10-28 LAB — LACTATE DEHYDROGENASE: LDH: 191 U/L (ref 94–250)

## 2011-10-28 LAB — COMPREHENSIVE METABOLIC PANEL
BUN: 9 mg/dL (ref 6–23)
CO2: 23 mEq/L (ref 19–32)
Creatinine, Ser: 0.81 mg/dL (ref 0.50–1.35)
Glucose, Bld: 93 mg/dL (ref 70–99)
Sodium: 138 mEq/L (ref 135–145)
Total Bilirubin: 0.3 mg/dL (ref 0.3–1.2)
Total Protein: 7.5 g/dL (ref 6.0–8.3)

## 2011-10-28 LAB — CMV DNA, QUANTITATIVE, PCR: Cytomegalovirus, DNA Quant PCR: <363 copies/mL (ref <363)

## 2011-10-28 NOTE — Telephone Encounter (Signed)
Received VM from pt with questioning if he would still be contagious from CMV pneumonia.  Pt states he has several friends who are currently pregnant and wonders if he should avoid them.   Note to Dr Cyndie Chime. dph

## 2011-10-28 NOTE — Telephone Encounter (Signed)
Message left on pt's cell # per Dr Cyndie Chime -   "Don't think he needs to worry - CMV is not easily transmitted to immunocompetent people."    dph

## 2011-10-29 ENCOUNTER — Other Ambulatory Visit: Payer: Self-pay

## 2011-10-29 ENCOUNTER — Other Ambulatory Visit: Payer: Self-pay | Admitting: Oncology

## 2011-10-29 ENCOUNTER — Ambulatory Visit (HOSPITAL_BASED_OUTPATIENT_CLINIC_OR_DEPARTMENT_OTHER): Payer: PRIVATE HEALTH INSURANCE

## 2011-10-29 ENCOUNTER — Other Ambulatory Visit: Payer: Self-pay | Admitting: Nurse Practitioner

## 2011-10-29 ENCOUNTER — Telehealth: Payer: Self-pay | Admitting: Oncology

## 2011-10-29 DIAGNOSIS — D709 Neutropenia, unspecified: Secondary | ICD-10-CM

## 2011-10-29 DIAGNOSIS — C9000 Multiple myeloma not having achieved remission: Secondary | ICD-10-CM

## 2011-10-29 LAB — KAPPA/LAMBDA LIGHT CHAINS
Kappa free light chain: 0.26 mg/dL — ABNORMAL LOW (ref 0.33–1.94)
Kappa:Lambda Ratio: 0.18 — ABNORMAL LOW (ref 0.26–1.65)
Lambda Free Lght Chn: 1.46 mg/dL (ref 0.57–2.63)

## 2011-10-29 MED ORDER — FILGRASTIM 300 MCG/0.5ML IJ SOLN
300.0000 ug | Freq: Once | INTRAMUSCULAR | Status: AC
  Administered 2011-10-29: 300 ug via SUBCUTANEOUS
  Filled 2011-10-29: qty 0.5

## 2011-10-29 MED ORDER — FILGRASTIM 480 MCG/0.8ML IJ SOLN: 480.0000 ug | Freq: Once | INTRAMUSCULAR | Status: DC

## 2011-10-29 NOTE — Telephone Encounter (Signed)
S/w pt today re inj appts for 3/20, 3/21, and 3/22. appts added per 3/20 pof.

## 2011-10-30 ENCOUNTER — Ambulatory Visit (HOSPITAL_BASED_OUTPATIENT_CLINIC_OR_DEPARTMENT_OTHER): Payer: PRIVATE HEALTH INSURANCE

## 2011-10-30 DIAGNOSIS — D709 Neutropenia, unspecified: Secondary | ICD-10-CM

## 2011-10-30 DIAGNOSIS — C9000 Multiple myeloma not having achieved remission: Secondary | ICD-10-CM

## 2011-10-30 MED ORDER — FILGRASTIM 300 MCG/0.5ML IJ SOLN
300.0000 ug | Freq: Once | INTRAMUSCULAR | Status: AC
  Administered 2011-10-30: 300 ug via SUBCUTANEOUS
  Filled 2011-10-30: qty 0.5

## 2011-10-30 NOTE — Patient Instructions (Signed)
Pt in for neupogen.  Pt without complaints and denies fever or any signs of infection.  Pt to call with questions, concerns, or any signs of infection d/t low counts.

## 2011-10-31 ENCOUNTER — Telehealth: Payer: Self-pay | Admitting: Oncology

## 2011-10-31 ENCOUNTER — Other Ambulatory Visit: Payer: Self-pay | Admitting: Oncology

## 2011-10-31 ENCOUNTER — Other Ambulatory Visit: Payer: Self-pay

## 2011-10-31 ENCOUNTER — Other Ambulatory Visit (HOSPITAL_BASED_OUTPATIENT_CLINIC_OR_DEPARTMENT_OTHER): Payer: PRIVATE HEALTH INSURANCE | Admitting: Lab

## 2011-10-31 ENCOUNTER — Ambulatory Visit (HOSPITAL_BASED_OUTPATIENT_CLINIC_OR_DEPARTMENT_OTHER): Payer: PRIVATE HEALTH INSURANCE

## 2011-10-31 DIAGNOSIS — C9000 Multiple myeloma not having achieved remission: Secondary | ICD-10-CM

## 2011-10-31 LAB — CBC WITH DIFFERENTIAL/PLATELET
Basophils Absolute: 0 10*3/uL (ref 0.0–0.1)
HCT: 36.9 % — ABNORMAL LOW (ref 38.4–49.9)
HGB: 12.6 g/dL — ABNORMAL LOW (ref 13.0–17.1)
LYMPH%: 17.3 % (ref 14.0–49.0)
MCH: 32.2 pg (ref 27.2–33.4)
MCHC: 34.3 g/dL (ref 32.0–36.0)
MONO#: 0.4 10*3/uL (ref 0.1–0.9)
NEUT%: 79.1 % — ABNORMAL HIGH (ref 39.0–75.0)
Platelets: 108 10*3/uL — ABNORMAL LOW (ref 140–400)
WBC: 13.5 10*3/uL — ABNORMAL HIGH (ref 4.0–10.3)
lymph#: 2.3 10*3/uL (ref 0.9–3.3)

## 2011-10-31 LAB — BASIC METABOLIC PANEL
BUN: 6 mg/dL (ref 6–23)
Chloride: 102 mEq/L (ref 96–112)
Glucose, Bld: 104 mg/dL — ABNORMAL HIGH (ref 70–99)
Potassium: 3.9 mEq/L (ref 3.5–5.3)
Sodium: 137 mEq/L (ref 135–145)

## 2011-10-31 LAB — MAGNESIUM: Magnesium: 2 mg/dL (ref 1.5–2.5)

## 2011-10-31 MED ORDER — ZOLEDRONIC ACID 4 MG/100ML IV SOLN
4.0000 mg | Freq: Once | INTRAVENOUS | Status: AC
  Administered 2011-10-31: 4 mg via INTRAVENOUS
  Filled 2011-10-31: qty 100

## 2011-10-31 NOTE — Telephone Encounter (Signed)
Pt came by to get copy of sch and to cx 422 and 4/29 labs as he will be at [facility]

## 2011-10-31 NOTE — Telephone Encounter (Signed)
Added lb to tx today. S/w pt re add on and new time for 1:15 pm today. Pt also aware that wkly lbs were added to start 3/25 and he will get a new schedule when he comes in.

## 2011-11-03 ENCOUNTER — Other Ambulatory Visit: Payer: Self-pay | Admitting: Oncology

## 2011-11-03 ENCOUNTER — Other Ambulatory Visit (HOSPITAL_BASED_OUTPATIENT_CLINIC_OR_DEPARTMENT_OTHER): Payer: PRIVATE HEALTH INSURANCE | Admitting: Lab

## 2011-11-03 ENCOUNTER — Other Ambulatory Visit: Payer: Self-pay | Admitting: Lab

## 2011-11-03 ENCOUNTER — Telehealth: Payer: Self-pay | Admitting: *Deleted

## 2011-11-03 DIAGNOSIS — C9000 Multiple myeloma not having achieved remission: Secondary | ICD-10-CM

## 2011-11-03 LAB — CBC WITH DIFFERENTIAL/PLATELET
BASO%: 0.5 % (ref 0.0–2.0)
Basophils Absolute: 0 10*3/uL (ref 0.0–0.1)
EOS%: 0.3 % (ref 0.0–7.0)
HCT: 39.1 % (ref 38.4–49.9)
HGB: 13.5 g/dL (ref 13.0–17.1)
LYMPH%: 51.2 % — ABNORMAL HIGH (ref 14.0–49.0)
MCH: 32 pg (ref 27.2–33.4)
MCHC: 34.6 g/dL (ref 32.0–36.0)
MCV: 92.4 fL (ref 79.3–98.0)
MONO%: 12.9 % (ref 0.0–14.0)
NEUT%: 35.1 % — ABNORMAL LOW (ref 39.0–75.0)

## 2011-11-03 LAB — BASIC METABOLIC PANEL
BUN: 6 mg/dL (ref 6–23)
Calcium: 9.5 mg/dL (ref 8.4–10.5)
Creatinine, Ser: 0.94 mg/dL (ref 0.50–1.35)

## 2011-11-03 LAB — MAGNESIUM: Magnesium: 2.2 mg/dL (ref 1.5–2.5)

## 2011-11-03 NOTE — Telephone Encounter (Signed)
called patient at [phone] and patient confirmed 11-05-2011 lab only 10:30am

## 2011-11-04 ENCOUNTER — Telehealth: Payer: Self-pay | Admitting: *Deleted

## 2011-11-04 NOTE — Telephone Encounter (Signed)
Pt. Notified of lab results per Dr. Cyndie Chime & labs were already routed to Dr. Greggory Stallion.

## 2011-11-04 NOTE — Telephone Encounter (Signed)
Message copied by Sabino Snipes on Tue Nov 04, 2011  4:52 PM ------      Message from: [name]      Created: Wed Oct 29, 2011  4:45 PM       Call pt  Kappa light chains now less than normal!.  Forward copy lab to Dr Greggory Stallion

## 2011-11-05 ENCOUNTER — Other Ambulatory Visit (HOSPITAL_BASED_OUTPATIENT_CLINIC_OR_DEPARTMENT_OTHER): Payer: PRIVATE HEALTH INSURANCE | Admitting: Lab

## 2011-11-05 DIAGNOSIS — C9000 Multiple myeloma not having achieved remission: Secondary | ICD-10-CM

## 2011-11-05 LAB — CBC WITH DIFFERENTIAL/PLATELET
Basophils Absolute: 0 10*3/uL (ref 0.0–0.1)
EOS%: 0.6 % (ref 0.0–7.0)
Eosinophils Absolute: 0 10*3/uL (ref 0.0–0.5)
HGB: 13.6 g/dL (ref 13.0–17.1)
LYMPH%: 50.6 % — ABNORMAL HIGH (ref 14.0–49.0)
MCH: 31.4 pg (ref 27.2–33.4)
MCV: 90.3 fL (ref 79.3–98.0)
MONO%: 20.2 % — ABNORMAL HIGH (ref 0.0–14.0)
NEUT#: 1 10*3/uL — ABNORMAL LOW (ref 1.5–6.5)
Platelets: 149 10*3/uL (ref 140–400)

## 2011-11-05 LAB — BASIC METABOLIC PANEL
BUN: 5 mg/dL — ABNORMAL LOW (ref 6–23)
Calcium: 8.5 mg/dL (ref 8.4–10.5)
Creatinine, Ser: 0.95 mg/dL (ref 0.50–1.35)
Glucose, Bld: 83 mg/dL (ref 70–99)

## 2011-11-10 ENCOUNTER — Other Ambulatory Visit: Payer: Self-pay | Admitting: Lab

## 2011-11-10 ENCOUNTER — Other Ambulatory Visit (HOSPITAL_BASED_OUTPATIENT_CLINIC_OR_DEPARTMENT_OTHER): Payer: PRIVATE HEALTH INSURANCE | Admitting: Lab

## 2011-11-10 DIAGNOSIS — C9000 Multiple myeloma not having achieved remission: Secondary | ICD-10-CM

## 2011-11-10 LAB — MAGNESIUM: Magnesium: 2.3 mg/dL (ref 1.5–2.5)

## 2011-11-10 LAB — BASIC METABOLIC PANEL
BUN: 5 mg/dL — ABNORMAL LOW (ref 6–23)
CO2: 27 mEq/L (ref 19–32)
Calcium: 8.7 mg/dL (ref 8.4–10.5)
Chloride: 105 mEq/L (ref 96–112)
Creatinine, Ser: 0.91 mg/dL (ref 0.50–1.35)
Glucose, Bld: 81 mg/dL (ref 70–99)
Potassium: 3.5 mEq/L (ref 3.5–5.3)
Sodium: 141 mEq/L (ref 135–145)

## 2011-11-10 LAB — CBC WITH DIFFERENTIAL/PLATELET
BASO%: 0.9 % (ref 0.0–2.0)
LYMPH%: 51.5 % — ABNORMAL HIGH (ref 14.0–49.0)
MCHC: 34.5 g/dL (ref 32.0–36.0)
MONO#: 0.5 10*3/uL (ref 0.1–0.9)
NEUT#: 1.1 10*3/uL — ABNORMAL LOW (ref 1.5–6.5)
RBC: 4.27 10*6/uL (ref 4.20–5.82)
RDW: 17.3 % — ABNORMAL HIGH (ref 11.0–14.6)
WBC: 3.4 10*3/uL — ABNORMAL LOW (ref 4.0–10.3)
lymph#: 1.8 10*3/uL (ref 0.9–3.3)
nRBC: 0 % (ref 0–0)

## 2011-11-17 ENCOUNTER — Other Ambulatory Visit: Payer: Self-pay | Admitting: Lab

## 2011-11-17 ENCOUNTER — Other Ambulatory Visit (HOSPITAL_BASED_OUTPATIENT_CLINIC_OR_DEPARTMENT_OTHER): Payer: PRIVATE HEALTH INSURANCE | Admitting: Lab

## 2011-11-17 DIAGNOSIS — C9000 Multiple myeloma not having achieved remission: Secondary | ICD-10-CM

## 2011-11-17 LAB — CBC WITH DIFFERENTIAL/PLATELET
Basophils Absolute: 0 10*3/uL (ref 0.0–0.1)
HCT: 37.9 % — ABNORMAL LOW (ref 38.4–49.9)
HGB: 13.1 g/dL (ref 13.0–17.1)
MCH: 32.1 pg (ref 27.2–33.4)
MONO#: 0.3 10*3/uL (ref 0.1–0.9)
NEUT%: 25.7 % — ABNORMAL LOW (ref 39.0–75.0)
Platelets: 113 10*3/uL — ABNORMAL LOW (ref 140–400)
WBC: 2.2 10*3/uL — ABNORMAL LOW (ref 4.0–10.3)
lymph#: 1.3 10*3/uL (ref 0.9–3.3)

## 2011-11-17 LAB — BASIC METABOLIC PANEL
Chloride: 105 mEq/L (ref 96–112)
Glucose, Bld: 68 mg/dL — ABNORMAL LOW (ref 70–99)
Potassium: 3.8 mEq/L (ref 3.5–5.3)
Sodium: 139 mEq/L (ref 135–145)

## 2011-11-17 LAB — MAGNESIUM: Magnesium: 2.1 mg/dL (ref 1.5–2.5)

## 2011-11-24 ENCOUNTER — Other Ambulatory Visit: Payer: Self-pay | Admitting: Oncology

## 2011-11-24 ENCOUNTER — Other Ambulatory Visit (HOSPITAL_BASED_OUTPATIENT_CLINIC_OR_DEPARTMENT_OTHER): Payer: PRIVATE HEALTH INSURANCE | Admitting: Lab

## 2011-11-24 DIAGNOSIS — C9000 Multiple myeloma not having achieved remission: Secondary | ICD-10-CM

## 2011-11-24 LAB — CBC WITH DIFFERENTIAL/PLATELET
BASO%: 0.8 % (ref 0.0–2.0)
EOS%: 2.5 % (ref 0.0–7.0)
Eosinophils Absolute: 0.1 10*3/uL (ref 0.0–0.5)
MCHC: 34.6 g/dL (ref 32.0–36.0)
MCV: 94.5 fL (ref 79.3–98.0)
MONO%: 24.9 % — ABNORMAL HIGH (ref 0.0–14.0)
NEUT#: 0.6 10*3/uL — ABNORMAL LOW (ref 1.5–6.5)
RBC: 4.19 10*6/uL — ABNORMAL LOW (ref 4.20–5.82)
RDW: 16.7 % — ABNORMAL HIGH (ref 11.0–14.6)
nRBC: 0 % (ref 0–0)

## 2011-11-24 LAB — BASIC METABOLIC PANEL
Calcium: 8.4 mg/dL (ref 8.4–10.5)
Sodium: 140 mEq/L (ref 135–145)

## 2011-11-24 LAB — MAGNESIUM: Magnesium: 2.1 mg/dL (ref 1.5–2.5)

## 2011-11-25 ENCOUNTER — Telehealth: Payer: Self-pay | Admitting: *Deleted

## 2011-11-25 NOTE — Telephone Encounter (Signed)
Labs from 11/24/11 faxed to [facility] & also routed to Dr Greggory Stallion.

## 2011-11-25 NOTE — Telephone Encounter (Signed)
Called pt and left message regarding lab on 11/27/11

## 2011-11-27 ENCOUNTER — Other Ambulatory Visit (HOSPITAL_BASED_OUTPATIENT_CLINIC_OR_DEPARTMENT_OTHER): Payer: PRIVATE HEALTH INSURANCE | Admitting: Lab

## 2011-11-27 ENCOUNTER — Telehealth: Payer: Self-pay | Admitting: *Deleted

## 2011-11-27 DIAGNOSIS — C9000 Multiple myeloma not having achieved remission: Secondary | ICD-10-CM

## 2011-11-27 LAB — CBC WITH DIFFERENTIAL/PLATELET
BASO%: 0.8 % (ref 0.0–2.0)
Basophils Absolute: 0 10*3/uL (ref 0.0–0.1)
EOS%: 2.6 % (ref 0.0–7.0)
HCT: 40.3 % (ref 38.4–49.9)
HGB: 13.8 g/dL (ref 13.0–17.1)
LYMPH%: 43.9 % (ref 14.0–49.0)
MCH: 32.6 pg (ref 27.2–33.4)
MCHC: 34.2 g/dL (ref 32.0–36.0)
MONO#: 0.7 10*3/uL (ref 0.1–0.9)
NEUT%: 33.1 % — ABNORMAL LOW (ref 39.0–75.0)
Platelets: 123 10*3/uL — ABNORMAL LOW (ref 140–400)
lymph#: 1.5 10*3/uL (ref 0.9–3.3)

## 2011-11-27 NOTE — Telephone Encounter (Signed)
Received vm call from [facility] stating that they had seen a report of serum free light chains in a 05/21/11 progress note from Dr. Cyndie Chime & asked that we fax that to [phone].  This was done & CBC result from today faxed to [phone]/Dr.Hurd.

## 2011-12-01 ENCOUNTER — Other Ambulatory Visit: Payer: Self-pay | Admitting: Lab

## 2011-12-04 ENCOUNTER — Other Ambulatory Visit: Payer: Self-pay

## 2011-12-04 ENCOUNTER — Telehealth: Payer: Self-pay | Admitting: Oncology

## 2011-12-04 ENCOUNTER — Other Ambulatory Visit: Payer: Self-pay | Admitting: Oncology

## 2011-12-04 NOTE — Telephone Encounter (Signed)
Called pt, left message regarding appt in May , labs , MD. Pt reminded to get calendar for 4/30 Zometa infusion

## 2011-12-05 ENCOUNTER — Telehealth: Payer: Self-pay | Admitting: Oncology

## 2011-12-05 ENCOUNTER — Telehealth: Payer: Self-pay | Admitting: *Deleted

## 2011-12-05 NOTE — Progress Notes (Signed)
BMBX results (performed at [facility] on 4/24) to Dr Cyndie Chime inbox. dph

## 2011-12-05 NOTE — Telephone Encounter (Signed)
Per staff message from [place] I have scheduled the patient for zometa treatments. Rose aware appts in computer.  JMW

## 2011-12-05 NOTE — Telephone Encounter (Signed)
Talked to pt, he is aware of appt on 12/09/11 Zometa and labs in may 2013

## 2011-12-08 ENCOUNTER — Other Ambulatory Visit: Payer: Self-pay | Admitting: Lab

## 2011-12-08 ENCOUNTER — Telehealth: Payer: Self-pay

## 2011-12-08 NOTE — Telephone Encounter (Signed)
The following email from pt given to Dr Cyndie Chime with pt's request for a letter to return to work.  dph

## 2011-12-08 NOTE — Telephone Encounter (Signed)
Message copied by Albertha Ghee on Mon Dec 08, 2011  4:35 PM ------      Message from: Susy Frizzle      Created: Mon Dec 08, 2011  4:03 PM      Regarding: Return to Work       [employer],             Here's what I need. A note that says I have been cleared to work nine hours per week starting May 6 and cleared to work full time starting June 1.             This letter needs to be sent to:            [facility]      Attn: Flint Melter      [address] [address]      [place], [place] [address]            [employer]      Attn: Royston Bake      [address] [place] [address]            Jacqlyn Larsen      Leave of Absence Admin - [facility] [place], [place] [address]            [facility]      [address]      [place], [place] [address]            Thanks for taking care of this for me. Let me know if you have any questions!            [place]aly Wajda

## 2011-12-09 ENCOUNTER — Ambulatory Visit (HOSPITAL_BASED_OUTPATIENT_CLINIC_OR_DEPARTMENT_OTHER): Payer: PRIVATE HEALTH INSURANCE

## 2011-12-09 DIAGNOSIS — C9000 Multiple myeloma not having achieved remission: Secondary | ICD-10-CM

## 2011-12-09 MED ORDER — ZOLEDRONIC ACID 4 MG/100ML IV SOLN
4.0000 mg | Freq: Once | INTRAVENOUS | Status: AC
  Administered 2011-12-09: 4 mg via INTRAVENOUS
  Filled 2011-12-09: qty 100

## 2011-12-09 MED ORDER — SODIUM CHLORIDE 0.9 % IV SOLN
Freq: Once | INTRAVENOUS | Status: AC
  Administered 2011-12-09: 16:00:00 via INTRAVENOUS

## 2011-12-09 NOTE — Patient Instructions (Signed)
[facility] Discharge Instructions for Patients Receiving Chemotherapy  Today you received the following chemotherapy agents Zometa  To help prevent nausea and vomiting after your treatment, we encourage you to take your nausea medication Begin taking it at 7 pm and take it as often as prescribed for the next 24 to 72 hours.   If you develop nausea and vomiting that is not controlled by your nausea medication, call the clinic. If it is after clinic hours your family physician or the after hours number for the clinic or go to the Emergency Department.   BELOW ARE SYMPTOMS THAT SHOULD BE REPORTED IMMEDIATELY:  *FEVER GREATER THAN 100.5 F  *CHILLS WITH OR WITHOUT FEVER  NAUSEA AND VOMITING THAT IS NOT CONTROLLED WITH YOUR NAUSEA MEDICATION  *UNUSUAL SHORTNESS OF BREATH  *UNUSUAL BRUISING OR BLEEDING  TENDERNESS IN MOUTH AND THROAT WITH OR WITHOUT PRESENCE OF ULCERS  *URINARY PROBLEMS  *BOWEL PROBLEMS  UNUSUAL RASH Items with * indicate a potential emergency and should be followed up as soon as possible.  One of the nurses will contact you 24 hours after your treatment. Please let the nurse know about any problems that you may have experienced. Feel free to call the clinic you have any questions or concerns. The clinic phone number is [phone].   I have been informed and understand all the instructions given to me. I know to contact the clinic, my physician, or go to the Emergency Department if any problems should occur. I do not have any questions at this time, but understand that I may call the clinic during office hours   should I have any questions or need assistance in obtaining follow up care.    __________________________________________  _____________  __________ Signature of Patient or Authorized Representative            Date                   Time    __________________________________________ Nurse's Signature    

## 2011-12-11 ENCOUNTER — Encounter: Payer: Self-pay | Admitting: Oncology

## 2011-12-11 ENCOUNTER — Other Ambulatory Visit: Payer: Self-pay | Admitting: Oncology

## 2011-12-12 ENCOUNTER — Encounter: Payer: Self-pay | Admitting: Oncology

## 2011-12-12 NOTE — Letter (Signed)
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  I am pleased to report that he has achieved a complete remission.  I authorize him to return to part- time employment, 9 hours per week, beginning Dec 15, 2011, and if he continues to do well, I authorize returning to full employment effective January 10, 2012.  Sincerely,    [name], M.D., F.A.C.P.  JMG/MEDQ  D:  12/11/2011  T:  12/11/2011  Job:  [id number]  CC:   [employer] Anette Guarneri, [place] of Absence Administr [facility] Pollyann Savoy, MD

## 2011-12-15 ENCOUNTER — Telehealth: Payer: Self-pay | Admitting: *Deleted

## 2011-12-15 ENCOUNTER — Ambulatory Visit (HOSPITAL_BASED_OUTPATIENT_CLINIC_OR_DEPARTMENT_OTHER): Payer: PRIVATE HEALTH INSURANCE | Admitting: Oncology

## 2011-12-15 ENCOUNTER — Other Ambulatory Visit (HOSPITAL_BASED_OUTPATIENT_CLINIC_OR_DEPARTMENT_OTHER): Payer: PRIVATE HEALTH INSURANCE | Admitting: Lab

## 2011-12-15 ENCOUNTER — Telehealth: Payer: Self-pay | Admitting: Oncology

## 2011-12-15 VITALS — BP 115/69 | HR 78 | Temp 97.4°F | Ht 67.0 in | Wt 156.0 lb

## 2011-12-15 DIAGNOSIS — C9000 Multiple myeloma not having achieved remission: Secondary | ICD-10-CM

## 2011-12-15 DIAGNOSIS — C9001 Multiple myeloma in remission: Secondary | ICD-10-CM

## 2011-12-15 LAB — CBC WITH DIFFERENTIAL/PLATELET
Basophils Absolute: 0 10*3/uL (ref 0.0–0.1)
Eosinophils Absolute: 0.1 10*3/uL (ref 0.0–0.5)
HCT: 40.8 % (ref 38.4–49.9)
HGB: 14.1 g/dL (ref 13.0–17.1)
LYMPH%: 36.7 % (ref 14.0–49.0)
MCV: 96.2 fL (ref 79.3–98.0)
MONO#: 0.5 10*3/uL (ref 0.1–0.9)
MONO%: 14.9 % — ABNORMAL HIGH (ref 0.0–14.0)
NEUT#: 1.5 10*3/uL (ref 1.5–6.5)
NEUT%: 43.8 % (ref 39.0–75.0)
Platelets: 133 10*3/uL — ABNORMAL LOW (ref 140–400)
RBC: 4.24 10*6/uL (ref 4.20–5.82)
WBC: 3.4 10*3/uL — ABNORMAL LOW (ref 4.0–10.3)
nRBC: 0 % (ref 0–0)

## 2011-12-15 MED ORDER — PNEUMOCOCCAL VAC POLYVALENT 25 MCG/0.5ML IJ INJ
0.5000 mL | INJECTION | INTRAMUSCULAR | Status: DC
  Filled 2011-12-15: qty 0.5

## 2011-12-15 NOTE — Progress Notes (Signed)
Hematology and Oncology Follow Up Visit  Ralph Barnett [id number] 02-24-1976 36 y.o. 12/15/2011 11:07 AM   Principle Diagnosis: Encounter Diagnosis  Name Primary?  . 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 Please see previous notes for full details. At this point in time, all respiratory symptoms related to a CMV pneumonia in which occurred at time of nadir blood counts from IV melphalan chemotherapy given in February and now resolved. He is off ganciclovir and back on acyclovir prophylaxis. He had a 60 day post transplant visit at [facility] on April 25 and ALT indicated his R. that he is in a complete remission. Kappa free light chains now in the normal range. No monoclonal protein on serum protein on immunofixation electrophoresis and more importantly bone marrow aspiration and biopsy showed a 0% plasma cells. He is due to start Revlimid maintenance done today an initial dose 10 mg daily. Weekly blood counts for the first month. No interim fever or infection. Resolving brachial plexopathy status post removal of right subclavian vascular access catheter.    Medications: reviewed  Allergies: No Known Allergies  Review of Systems: Constitutional:   No constitutional symptoms Respiratory: No cough or dyspnea Cardiovascular:  No chest pain or palpitations Gastrointestinal: No abdominal pain Genito-Urinary: Not questioned Musculoskeletal: No bone pain Neurologic: Not questioned Skin: Not questioned Remaining ROS negative.  Physical Exam: Blood pressure 115/69, pulse 78, temperature 97.4 F (36.3 C), temperature source Oral, height 5\' 7"  (1.702 m), weight 156 lb (70.761 kg). Wt Readings from Last 3 Encounters:  12/15/11 156 lb (70.761 kg)    10/27/11 149 lb 11.2 oz (67.903 kg)  09/29/11 158 lb 11.2 oz (71.986 kg)     General appearance: Well-nourished Caucasian man. Hair is starting to grow back. HENNT: Pharynx no erythema or exudate. No ulcers. Lymph nodes: No cervical supraclavicular or axillary adenopathy. Breasts: Lungs: Clear to auscultation resonant to percussion throughout. Heart: Regular rhythm no murmur or gallop. Abdomen: Soft nontender. Extremities: No edema no calf tenderness. Vascular: No cyanosis. Neurologic: Motor strength 5 over 5. Reflexes 2+ symmetric. Sensation minimally decreased to vibration by tuning fork exam over the fingertips Skin: No rash or ecchymoses  Lab Results: Lab Results  Component Value Date   WBC 3.4* 12/15/2011   HGB 14.1 12/15/2011   HCT 40.8 12/15/2011   MCV 96.2 12/15/2011   PLT 133* 12/15/2011     Chemistry      Component Value Date/Time   NA 140 11/24/2011 1340   K 3.8 11/24/2011 1340   CL 107 11/24/2011 1340   CO2 22 11/24/2011 1340   BUN 8 11/24/2011 1340   CREATININE 0.91 11/24/2011 1340   CREATININE 1.09 08/26/2011 1019      Component Value Date/Time   CALCIUM 8.4 11/24/2011 1340   CALCIUM 10.6* 05/15/2011 1547   ALKPHOS 73 10/27/2011 1015   AST 35 10/27/2011 1015   ALT 40 10/27/2011 1015   BILITOT 0.3 10/27/2011 1015       Impression and Plan: #1. Kappa light chain myeloma  initial heavy tumor burden with multiple lytic bone lesions, 42% plasma cells on the initial bone marrow done on 05/15/2011 but this underestimated the extent of disease since there were large aggregates and sheets of plasma cells in an  80%  cellular marrow, significant elevation of serum kappa free light chains at 301 mg percent, but absence of proteinuria or a monoclonal free light chains in the urine. 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 (good prognostic category). Initial treatment with RVD  induction followed by Cytoxan 1.5 g per meter squared daily x3 beginning 09/10/2011, stem cell  harvest on recovery, then IV melphalan 200  mg per meter squared given 10/01/2011 at [facility] in [place]. Currently in a complete response. Plan: Proceed with Revlimid consolidation. Vaccine schedule provided by [facility]: He will receive a Pneumovax and probably measles mumps and rubella vaccine six-month post transplant which will be end of August. Annual flu vaccine in the fall. DTP vaccine  one year post transplant February 2014. We will continue acyclovir prophylaxis for one year and Bactrim prophylaxis through June of this year. He will continue monthly Zometa infusions.  #2. CMV pneumonia complication of transplant resolved with treatment and recovery of blood counts  #3. Prolonged neutropenia following transplant. Partially related to valganciclovir with significant improvement when the drug was stopped.   CC:. Dr. Birdie Sons; Dr. Marlaine Hind   [name], MD 5/6/201311:07 AM

## 2011-12-15 NOTE — Telephone Encounter (Signed)
Per staff message from [place], I have scheduled treatment appts for the patient. Appts in the computer and Thurston Hole aware.  JMW

## 2011-12-15 NOTE — Telephone Encounter (Signed)
appts made,mw to add zometa,i will inbox appts to pt per pt req    aom

## 2011-12-19 ENCOUNTER — Telehealth: Payer: Self-pay | Admitting: *Deleted

## 2011-12-19 NOTE — Telephone Encounter (Signed)
Per staff message from [place], the patient requested to move his treatment appts from 7/23 to 7/22. Appts moved and Rose aware.  JMW

## 2011-12-22 ENCOUNTER — Other Ambulatory Visit (HOSPITAL_BASED_OUTPATIENT_CLINIC_OR_DEPARTMENT_OTHER): Payer: PRIVATE HEALTH INSURANCE | Admitting: Lab

## 2011-12-22 DIAGNOSIS — C9000 Multiple myeloma not having achieved remission: Secondary | ICD-10-CM

## 2011-12-22 LAB — CBC WITH DIFFERENTIAL/PLATELET
Basophils Absolute: 0 10*3/uL (ref 0.0–0.1)
EOS%: 4.1 % (ref 0.0–7.0)
HCT: 41.3 % (ref 38.4–49.9)
HGB: 14.3 g/dL (ref 13.0–17.1)
LYMPH%: 31.5 % (ref 14.0–49.0)
MCH: 33.2 pg (ref 27.2–33.4)
MCHC: 34.5 g/dL (ref 32.0–36.0)
NEUT%: 50.4 % (ref 39.0–75.0)
Platelets: 120 10*3/uL — ABNORMAL LOW (ref 140–400)
lymph#: 1.3 10*3/uL (ref 0.9–3.3)

## 2011-12-29 ENCOUNTER — Other Ambulatory Visit (HOSPITAL_BASED_OUTPATIENT_CLINIC_OR_DEPARTMENT_OTHER): Payer: PRIVATE HEALTH INSURANCE | Admitting: Lab

## 2011-12-29 DIAGNOSIS — C9000 Multiple myeloma not having achieved remission: Secondary | ICD-10-CM

## 2011-12-29 LAB — CBC WITH DIFFERENTIAL/PLATELET
EOS%: 3.2 % (ref 0.0–7.0)
Eosinophils Absolute: 0.2 10*3/uL (ref 0.0–0.5)
LYMPH%: 34 % (ref 14.0–49.0)
MCH: 32.7 pg (ref 27.2–33.4)
MCV: 95.3 fL (ref 79.3–98.0)
MONO%: 17.6 % — ABNORMAL HIGH (ref 0.0–14.0)
NEUT#: 2.2 10*3/uL (ref 1.5–6.5)
Platelets: 117 10*3/uL — ABNORMAL LOW (ref 140–400)
RBC: 4.26 10*6/uL (ref 4.20–5.82)
nRBC: 0 % (ref 0–0)

## 2012-01-01 ENCOUNTER — Other Ambulatory Visit: Payer: Self-pay | Admitting: Oncology

## 2012-01-06 ENCOUNTER — Ambulatory Visit (HOSPITAL_BASED_OUTPATIENT_CLINIC_OR_DEPARTMENT_OTHER): Payer: PRIVATE HEALTH INSURANCE

## 2012-01-06 ENCOUNTER — Other Ambulatory Visit (HOSPITAL_BASED_OUTPATIENT_CLINIC_OR_DEPARTMENT_OTHER): Payer: PRIVATE HEALTH INSURANCE | Admitting: Lab

## 2012-01-06 DIAGNOSIS — C9000 Multiple myeloma not having achieved remission: Secondary | ICD-10-CM

## 2012-01-06 LAB — CBC WITH DIFFERENTIAL/PLATELET
BASO%: 1 % (ref 0.0–2.0)
EOS%: 5 % (ref 0.0–7.0)
HCT: 42.5 % (ref 38.4–49.9)
LYMPH%: 42 % (ref 14.0–49.0)
MCH: 32.8 pg (ref 27.2–33.4)
MCHC: 34.6 g/dL (ref 32.0–36.0)
NEUT%: 31.8 % — ABNORMAL LOW (ref 39.0–75.0)
Platelets: 136 10*3/uL — ABNORMAL LOW (ref 140–400)
RBC: 4.47 10*6/uL (ref 4.20–5.82)
lymph#: 1.5 10*3/uL (ref 0.9–3.3)

## 2012-01-06 LAB — BASIC METABOLIC PANEL
BUN: 9 mg/dL (ref 6–23)
Calcium: 8.8 mg/dL (ref 8.4–10.5)
Creatinine, Ser: 1.28 mg/dL (ref 0.50–1.35)

## 2012-01-06 MED ORDER — ZOLEDRONIC ACID 4 MG/100ML IV SOLN
4.0000 mg | Freq: Once | INTRAVENOUS | Status: AC
  Administered 2012-01-06: 4 mg via INTRAVENOUS
  Filled 2012-01-06: qty 100

## 2012-01-08 ENCOUNTER — Encounter: Payer: Self-pay | Admitting: Oncology

## 2012-01-09 ENCOUNTER — Telehealth: Payer: Self-pay | Admitting: *Deleted

## 2012-01-09 ENCOUNTER — Telehealth: Payer: Self-pay | Admitting: Oncology

## 2012-01-09 NOTE — Telephone Encounter (Signed)
Per staff message from [place], I have added velcade to his monthly treatments.   JMW

## 2012-01-09 NOTE — Telephone Encounter (Signed)
Talked to pt, gave him appt for Velcade, pt wants to clarify if he needs Velcade, emailed Dr. Cyndie Chime and waiting for answer.

## 2012-01-14 ENCOUNTER — Telehealth: Payer: Self-pay | Admitting: Oncology

## 2012-01-14 NOTE — Telephone Encounter (Signed)
Talked to pt, he is aware of Velcade being cancelled, forwarded email of Dr. Cyndie Chime to Marcelino Duster, chemo scheduler, regarding cancellation

## 2012-01-15 ENCOUNTER — Other Ambulatory Visit: Payer: Self-pay | Admitting: *Deleted

## 2012-01-15 DIAGNOSIS — C9000 Multiple myeloma not having achieved remission: Secondary | ICD-10-CM

## 2012-01-16 ENCOUNTER — Telehealth: Payer: Self-pay | Admitting: Oncology

## 2012-01-16 ENCOUNTER — Other Ambulatory Visit: Payer: Self-pay | Admitting: Oncology

## 2012-01-16 DIAGNOSIS — C9000 Multiple myeloma not having achieved remission: Secondary | ICD-10-CM

## 2012-01-16 DIAGNOSIS — D702 Other drug-induced agranulocytosis: Secondary | ICD-10-CM

## 2012-01-16 NOTE — Telephone Encounter (Signed)
Called pt and left message regarding lab on Monday 01/19/12

## 2012-01-19 ENCOUNTER — Other Ambulatory Visit (HOSPITAL_BASED_OUTPATIENT_CLINIC_OR_DEPARTMENT_OTHER): Payer: PRIVATE HEALTH INSURANCE | Admitting: Lab

## 2012-01-19 DIAGNOSIS — C9000 Multiple myeloma not having achieved remission: Secondary | ICD-10-CM

## 2012-01-19 LAB — CBC WITH DIFFERENTIAL/PLATELET
Eosinophils Absolute: 0.1 10*3/uL (ref 0.0–0.5)
MONO#: 0.7 10*3/uL (ref 0.1–0.9)
MONO%: 19.1 % — ABNORMAL HIGH (ref 0.0–14.0)
NEUT#: 0.9 10*3/uL — ABNORMAL LOW (ref 1.5–6.5)
RBC: 4.52 10*6/uL (ref 4.20–5.82)
RDW: 12.6 % (ref 11.0–14.6)
WBC: 3.7 10*3/uL — ABNORMAL LOW (ref 4.0–10.3)
lymph#: 1.8 10*3/uL (ref 0.9–3.3)

## 2012-01-20 ENCOUNTER — Other Ambulatory Visit: Payer: Self-pay | Admitting: *Deleted

## 2012-01-20 DIAGNOSIS — C9000 Multiple myeloma not having achieved remission: Secondary | ICD-10-CM

## 2012-01-26 ENCOUNTER — Other Ambulatory Visit (HOSPITAL_BASED_OUTPATIENT_CLINIC_OR_DEPARTMENT_OTHER): Payer: PRIVATE HEALTH INSURANCE

## 2012-01-26 DIAGNOSIS — C9000 Multiple myeloma not having achieved remission: Secondary | ICD-10-CM

## 2012-01-26 DIAGNOSIS — D492 Neoplasm of unspecified behavior of bone, soft tissue, and skin: Secondary | ICD-10-CM

## 2012-01-26 DIAGNOSIS — D702 Other drug-induced agranulocytosis: Secondary | ICD-10-CM

## 2012-01-26 LAB — CBC WITH DIFFERENTIAL/PLATELET
Eosinophils Absolute: 0.1 10*3/uL (ref 0.0–0.5)
HCT: 41.8 % (ref 38.4–49.9)
HGB: 14.4 g/dL (ref 13.0–17.1)
LYMPH%: 53.6 % — ABNORMAL HIGH (ref 14.0–49.0)
MONO#: 0.5 10*3/uL (ref 0.1–0.9)
NEUT#: 0.8 10*3/uL — ABNORMAL LOW (ref 1.5–6.5)
Platelets: 139 10*3/uL — ABNORMAL LOW (ref 140–400)
RBC: 4.49 10*6/uL (ref 4.20–5.82)
WBC: 3.2 10*3/uL — ABNORMAL LOW (ref 4.0–10.3)

## 2012-01-26 LAB — BASIC METABOLIC PANEL
CO2: 26 mEq/L (ref 19–32)
Calcium: 8.9 mg/dL (ref 8.4–10.5)
Creatinine, Ser: 1.13 mg/dL (ref 0.50–1.35)
Glucose, Bld: 89 mg/dL (ref 70–99)

## 2012-01-29 ENCOUNTER — Other Ambulatory Visit: Payer: Self-pay | Admitting: Oncology

## 2012-01-29 DIAGNOSIS — C9000 Multiple myeloma not having achieved remission: Secondary | ICD-10-CM

## 2012-01-29 DIAGNOSIS — M899 Disorder of bone, unspecified: Secondary | ICD-10-CM

## 2012-01-30 ENCOUNTER — Ambulatory Visit (HOSPITAL_BASED_OUTPATIENT_CLINIC_OR_DEPARTMENT_OTHER)
Admission: RE | Admit: 2012-01-30 | Discharge: 2012-01-30 | Disposition: A | Discharge status: Home / Self Care | Payer: PRIVATE HEALTH INSURANCE | Source: Ambulatory Visit | Attending: Oncology | Admitting: Oncology

## 2012-01-30 DIAGNOSIS — M899 Disorder of bone, unspecified: Secondary | ICD-10-CM | POA: Insufficient documentation

## 2012-01-30 DIAGNOSIS — R6884 Jaw pain: Secondary | ICD-10-CM | POA: Insufficient documentation

## 2012-01-30 DIAGNOSIS — M949 Disorder of cartilage, unspecified: Secondary | ICD-10-CM | POA: Insufficient documentation

## 2012-01-30 DIAGNOSIS — C9000 Multiple myeloma not having achieved remission: Secondary | ICD-10-CM | POA: Insufficient documentation

## 2012-02-03 ENCOUNTER — Other Ambulatory Visit (HOSPITAL_BASED_OUTPATIENT_CLINIC_OR_DEPARTMENT_OTHER): Payer: PRIVATE HEALTH INSURANCE | Admitting: Lab

## 2012-02-03 ENCOUNTER — Telehealth: Payer: Self-pay | Admitting: *Deleted

## 2012-02-03 ENCOUNTER — Ambulatory Visit (HOSPITAL_BASED_OUTPATIENT_CLINIC_OR_DEPARTMENT_OTHER): Payer: PRIVATE HEALTH INSURANCE

## 2012-02-03 VITALS — BP 108/67 | HR 73 | Temp 98.3°F

## 2012-02-03 DIAGNOSIS — C9000 Multiple myeloma not having achieved remission: Secondary | ICD-10-CM

## 2012-02-03 DIAGNOSIS — M899 Disorder of bone, unspecified: Secondary | ICD-10-CM

## 2012-02-03 DIAGNOSIS — D702 Other drug-induced agranulocytosis: Secondary | ICD-10-CM

## 2012-02-03 LAB — CBC WITH DIFFERENTIAL/PLATELET
BASO%: 0.7 % (ref 0.0–2.0)
EOS%: 4.2 % (ref 0.0–7.0)
HCT: 45.1 % (ref 38.4–49.9)
MCH: 32.4 pg (ref 27.2–33.4)
MCHC: 34.7 g/dL (ref 32.0–36.0)
MCV: 93.3 fL (ref 79.3–98.0)
MONO%: 14.4 % — ABNORMAL HIGH (ref 0.0–14.0)
NEUT%: 47.4 % (ref 39.0–75.0)
RDW: 12.5 % (ref 11.0–14.6)
lymph#: 1.3 10*3/uL (ref 0.9–3.3)

## 2012-02-03 MED ORDER — ZOLEDRONIC ACID 4 MG/100ML IV SOLN
4.0000 mg | Freq: Once | INTRAVENOUS | Status: AC
  Administered 2012-02-03: 4 mg via INTRAVENOUS
  Filled 2012-02-03: qty 100

## 2012-02-03 NOTE — Patient Instructions (Signed)
[facility] Discharge Instructions for Patients Receiving Chemotherapy  Today you received the following chemotherapy agents Zometa To help prevent nausea and vomiting after your treatment, we encourage you to take your nausea medication as prescribed. If you develop nausea and vomiting that is not controlled by your nausea medication, call the clinic. If it is after clinic hours your family physician or the after hours number for the clinic or go to the Emergency Department.   BELOW ARE SYMPTOMS THAT SHOULD BE REPORTED IMMEDIATELY:  *FEVER GREATER THAN 100.5 F  *CHILLS WITH OR WITHOUT FEVER  NAUSEA AND VOMITING THAT IS NOT CONTROLLED WITH YOUR NAUSEA MEDICATION  *UNUSUAL SHORTNESS OF BREATH  *UNUSUAL BRUISING OR BLEEDING  TENDERNESS IN MOUTH AND THROAT WITH OR WITHOUT PRESENCE OF ULCERS  *URINARY PROBLEMS  *BOWEL PROBLEMS  UNUSUAL RASH Items with * indicate a potential emergency and should be followed up as soon as possible.  One of the nurses will contact you 24 hours after your treatment. Please let the nurse know about any problems that you may have experienced. Feel free to call the clinic you have any questions or concerns. The clinic phone number is [phone].   I have been informed and understand all the instructions given to me. I know to contact the clinic, my physician, or go to the Emergency Department if any problems should occur. I do not have any questions at this time, but understand that I may call the clinic during office hours   should I have any questions or need assistance in obtaining follow up care.    __________________________________________  _____________  __________ Signature of Patient or Authorized Representative            Date                   Time    __________________________________________ Nurse's Signature    

## 2012-02-03 NOTE — Telephone Encounter (Signed)
Called pt to discuss labs.  He was here earlier for zometa.  Canceled b-met today b/c this was done 01/26/12.  Pt should have appt for lab 02/09/12.  He states he is working that day & will get it done at [employer]. [employer].  He states that he plans to start his revlimid today.  His labs will be faxed to [facility].

## 2012-03-01 ENCOUNTER — Other Ambulatory Visit: Payer: Self-pay | Admitting: Nurse Practitioner

## 2012-03-01 ENCOUNTER — Other Ambulatory Visit (HOSPITAL_BASED_OUTPATIENT_CLINIC_OR_DEPARTMENT_OTHER): Payer: PRIVATE HEALTH INSURANCE | Admitting: Lab

## 2012-03-01 ENCOUNTER — Ambulatory Visit (HOSPITAL_BASED_OUTPATIENT_CLINIC_OR_DEPARTMENT_OTHER): Payer: PRIVATE HEALTH INSURANCE

## 2012-03-01 VITALS — BP 117/67 | HR 66 | Temp 98.2°F

## 2012-03-01 DIAGNOSIS — C9000 Multiple myeloma not having achieved remission: Secondary | ICD-10-CM

## 2012-03-01 DIAGNOSIS — M899 Disorder of bone, unspecified: Secondary | ICD-10-CM

## 2012-03-01 LAB — CBC WITH DIFFERENTIAL/PLATELET
BASO%: 1.5 % (ref 0.0–2.0)
Basophils Absolute: 0 10*3/uL (ref 0.0–0.1)
EOS%: 3.6 % (ref 0.0–7.0)
HGB: 14.6 g/dL (ref 13.0–17.1)
MCH: 32.6 pg (ref 27.2–33.4)
MONO%: 17.8 % — ABNORMAL HIGH (ref 0.0–14.0)
RBC: 4.47 10*6/uL (ref 4.20–5.82)
RDW: 13.9 % (ref 11.0–14.6)
lymph#: 1.2 10*3/uL (ref 0.9–3.3)

## 2012-03-01 MED ORDER — SODIUM CHLORIDE 0.9 % IV SOLN
Freq: Once | INTRAVENOUS | Status: AC
  Administered 2012-03-01: 09:00:00 via INTRAVENOUS

## 2012-03-01 MED ORDER — ZOLEDRONIC ACID 4 MG/100ML IV SOLN
4.0000 mg | Freq: Once | INTRAVENOUS | Status: AC
  Administered 2012-03-01: 4 mg via INTRAVENOUS
  Filled 2012-03-01: qty 100

## 2012-03-01 NOTE — Patient Instructions (Signed)
 ZOLEDRONIC ACID (ZOE le dron ik AS id) lowers the amount of calcium loss from bone. It is used to treat too much calcium in your blood from cancer. It is also used to prevent complications of cancer that has spread to the bone. This medicine may be used for other purposes; ask your health care provider or pharmacist if you have questions. What should I tell my health care provider before I take this medicine? They need to know if you have any of these conditions: -aspirin-sensitive asthma -dental disease -kidney disease -an unusual or allergic reaction to zoledronic acid, other medicines, foods, dyes, or preservatives -pregnant or trying to get pregnant -breast-feeding How should I use this medicine? This medicine is for infusion into a vein. It is given by a health care professional in a hospital or clinic setting. Talk to your pediatrician regarding the use of this medicine in children. Special care may be needed. Overdosage: If you think you have taken too much of this medicine contact a poison control center or emergency room at once. NOTE: This medicine is only for you. Do not share this medicine with others. What if I miss a dose? It is important not to miss your dose. Call your doctor or health care professional if you are unable to keep an appointment. What may interact with this medicine? -certain antibiotics given by injection -NSAIDs, medicines for pain and inflammation, like ibuprofen or naproxen -some diuretics like bumetanide, furosemide -teriparatide -thalidomide This list may not describe all possible interactions. Give your health care provider a list of all the medicines, herbs, non-prescription drugs, or dietary supplements you use. Also tell them if you smoke, drink alcohol, or use illegal drugs. Some items may interact with your medicine. What should I watch for while using this medicine? Visit your doctor or health care professional for regular checkups. It may be  some time before you see the benefit from this medicine. Do not stop taking your medicine unless your doctor tells you to. Your doctor may order blood tests or other tests to see how you are doing. Women should inform their doctor if they wish to become pregnant or think they might be pregnant. There is a potential for serious side effects to an unborn child. Talk to your health care professional or pharmacist for more information. You should make sure that you get enough calcium and vitamin D while you are taking this medicine. Discuss the foods you eat and the vitamins you take with your health care professional. Some people who take this medicine have severe bone, joint, and/or muscle pain. This medicine may also increase your risk for a broken thigh bone. Tell your doctor right away if you have pain in your upper leg or groin. Tell your doctor if you have any pain that does not go away or that gets worse. What side effects may I notice from receiving this medicine? Side effects that you should report to your doctor or health care professional as soon as possible: -allergic reactions like skin rash, itching or hives, swelling of the face, lips, or tongue -anxiety, confusion, or depression -breathing problems -changes in vision -feeling faint or lightheaded, falls -jaw burning, cramping, pain -muscle cramps, stiffness, or weakness -trouble passing urine or change in the amount of urine Side effects that usually do not require medical attention (report to your doctor or health care professional if they continue or are bothersome): -bone, joint, or muscle pain -fever -hair loss -irritation at site where injected -  loss of appetite -nausea, vomiting -stomach upset -tired This list may not describe all possible side effects. Call your doctor for medical advice about side effects. You may report side effects to FDA at 1-800-FDA-1088. Where should I keep my medicine? This drug is given in a  hospital or clinic and will not be stored at home. NOTE: This sheet is a summary. It may not cover all possible information. If you have questions about this medicine, talk to your doctor, pharmacist, or health care provider.  2012, Elsevier/[employer] Standard. (01/24/2011 9:06:58 AM) 

## 2012-03-02 ENCOUNTER — Other Ambulatory Visit: Payer: PRIVATE HEALTH INSURANCE

## 2012-03-02 ENCOUNTER — Ambulatory Visit: Payer: PRIVATE HEALTH INSURANCE

## 2012-03-02 LAB — BASIC METABOLIC PANEL
Chloride: 106 mEq/L (ref 96–112)
Potassium: 3.9 mEq/L (ref 3.5–5.3)
Sodium: 140 mEq/L (ref 135–145)

## 2012-03-08 ENCOUNTER — Other Ambulatory Visit: Payer: Self-pay | Admitting: Internal Medicine

## 2012-03-08 DIAGNOSIS — R21 Rash and other nonspecific skin eruption: Secondary | ICD-10-CM

## 2012-03-24 ENCOUNTER — Other Ambulatory Visit: Payer: Self-pay | Admitting: Oncology

## 2012-03-25 ENCOUNTER — Telehealth: Payer: Self-pay | Admitting: Oncology

## 2012-03-25 ENCOUNTER — Other Ambulatory Visit: Payer: Self-pay | Admitting: Oncology

## 2012-03-25 NOTE — Telephone Encounter (Signed)
Talked to pt, he is aware of all his appts for August 2013 lab, chemo and MD

## 2012-03-29 ENCOUNTER — Other Ambulatory Visit (HOSPITAL_BASED_OUTPATIENT_CLINIC_OR_DEPARTMENT_OTHER): Payer: PRIVATE HEALTH INSURANCE | Admitting: Lab

## 2012-03-29 ENCOUNTER — Ambulatory Visit (HOSPITAL_BASED_OUTPATIENT_CLINIC_OR_DEPARTMENT_OTHER): Payer: PRIVATE HEALTH INSURANCE

## 2012-03-29 ENCOUNTER — Ambulatory Visit: Payer: PRIVATE HEALTH INSURANCE

## 2012-03-29 ENCOUNTER — Telehealth: Payer: Self-pay | Admitting: *Deleted

## 2012-03-29 VITALS — BP 116/69 | HR 94 | Temp 98.4°F | Resp 16

## 2012-03-29 DIAGNOSIS — C9000 Multiple myeloma not having achieved remission: Secondary | ICD-10-CM

## 2012-03-29 DIAGNOSIS — C9001 Multiple myeloma in remission: Secondary | ICD-10-CM

## 2012-03-29 DIAGNOSIS — M899 Disorder of bone, unspecified: Secondary | ICD-10-CM

## 2012-03-29 LAB — CBC WITH DIFFERENTIAL/PLATELET
BASO%: 1.5 % (ref 0.0–2.0)
EOS%: 8.4 % — ABNORMAL HIGH (ref 0.0–7.0)
LYMPH%: 36.7 % (ref 14.0–49.0)
MCH: 32.4 pg (ref 27.2–33.4)
MCHC: 34.6 g/dL (ref 32.0–36.0)
MONO#: 0.7 10*3/uL (ref 0.1–0.9)
Platelets: 99 10*3/uL — ABNORMAL LOW (ref 140–400)
RBC: 4.67 10*6/uL (ref 4.20–5.82)
WBC: 3.2 10*3/uL — ABNORMAL LOW (ref 4.0–10.3)

## 2012-03-29 MED ORDER — SODIUM CHLORIDE 0.9 % IV SOLN
Freq: Once | INTRAVENOUS | Status: AC
  Administered 2012-03-29: 10:00:00 via INTRAVENOUS

## 2012-03-29 MED ORDER — ALTEPLASE 2 MG IJ SOLR
2.0000 mg | Freq: Once | INTRAMUSCULAR | Status: DC | PRN
  Filled 2012-03-29: qty 2

## 2012-03-29 MED ORDER — ZOLEDRONIC ACID 4 MG/100ML IV SOLN
4.0000 mg | Freq: Once | INTRAVENOUS | Status: AC
  Administered 2012-03-29: 4 mg via INTRAVENOUS
  Filled 2012-03-29: qty 100

## 2012-03-29 NOTE — Telephone Encounter (Signed)
Copy of CBC faxed to [facility] to [phone].

## 2012-03-29 NOTE — Patient Instructions (Addendum)
[facility] Discharge Instructions for Patients Receiving Chemotherapy  Today you received the following chemotherapy agents zometa  If you develop nausea and vomiting that is not controlled by your nausea medication, call the clinic. If it is after clinic hours your family physician or the after hours number for the clinic or go to the Emergency Department.   BELOW ARE SYMPTOMS THAT SHOULD BE REPORTED IMMEDIATELY:  *FEVER GREATER THAN 100.5 F  *CHILLS WITH OR WITHOUT FEVER  NAUSEA AND VOMITING THAT IS NOT CONTROLLED WITH YOUR NAUSEA MEDICATION  *UNUSUAL SHORTNESS OF BREATH  *UNUSUAL BRUISING OR BLEEDING  TENDERNESS IN MOUTH AND THROAT WITH OR WITHOUT PRESENCE OF ULCERS  *URINARY PROBLEMS  *BOWEL PROBLEMS  UNUSUAL RASH Items with * indicate a potential emergency and should be followed up as soon as possible.  One of the nurses will contact you 24 hours after your treatment. Please let the nurse know about any problems that you may have experienced. Feel free to call the clinic you have any questions or concerns. The clinic phone number is [phone].   I have been informed and understand all the instructions given to me. I know to contact the clinic, my physician, or go to the Emergency Department if any problems should occur. I do not have any questions at this time, but understand that I may call the clinic during office hours   should I have any questions or need assistance in obtaining follow up care.    __________________________________________  _____________  __________ Signature of Patient or Authorized Representative            Date                   Time    __________________________________________ Nurse's Signature    

## 2012-04-02 LAB — COMPREHENSIVE METABOLIC PANEL
ALT: 34 U/L (ref 0–53)
AST: 27 U/L (ref 0–37)
Alkaline Phosphatase: 60 U/L (ref 39–117)
CO2: 25 mEq/L (ref 19–32)
Sodium: 142 mEq/L (ref 135–145)
Total Bilirubin: 0.4 mg/dL (ref 0.3–1.2)
Total Protein: 6.5 g/dL (ref 6.0–8.3)

## 2012-04-02 LAB — IMMUNOFIXATION ELECTROPHORESIS
IgA: 11 mg/dL — ABNORMAL LOW (ref 68–379)
IgM, Serum: 14 mg/dL — ABNORMAL LOW (ref 41–251)

## 2012-04-02 LAB — KAPPA/LAMBDA LIGHT CHAINS
Kappa free light chain: 0.9 mg/dL (ref 0.33–1.94)
Kappa:Lambda Ratio: 1.25 (ref 0.26–1.65)
Lambda Free Lght Chn: 0.72 mg/dL (ref 0.57–2.63)

## 2012-04-02 LAB — LACTATE DEHYDROGENASE: LDH: 154 U/L (ref 94–250)

## 2012-04-05 ENCOUNTER — Ambulatory Visit (HOSPITAL_BASED_OUTPATIENT_CLINIC_OR_DEPARTMENT_OTHER): Payer: PRIVATE HEALTH INSURANCE | Admitting: Oncology

## 2012-04-05 ENCOUNTER — Telehealth: Payer: Self-pay | Admitting: Oncology

## 2012-04-05 VITALS — BP 110/72 | HR 82 | Temp 99.5°F | Resp 20 | Ht 67.0 in | Wt 161.5 lb

## 2012-04-05 DIAGNOSIS — J849 Interstitial pulmonary disease, unspecified: Secondary | ICD-10-CM

## 2012-04-05 DIAGNOSIS — M899 Disorder of bone, unspecified: Secondary | ICD-10-CM

## 2012-04-05 DIAGNOSIS — C9000 Multiple myeloma not having achieved remission: Secondary | ICD-10-CM

## 2012-04-05 DIAGNOSIS — Z9484 Stem cells transplant status: Secondary | ICD-10-CM

## 2012-04-05 DIAGNOSIS — D708 Other neutropenia: Secondary | ICD-10-CM

## 2012-04-05 NOTE — Telephone Encounter (Signed)
gv pt appt schedule for Sept thru Dec including mri/skeletal survey for 10/16.

## 2012-04-05 NOTE — Progress Notes (Signed)
Hematology and Oncology Follow Up Visit  Ralph Barnett [id number] 1976-05-28 36 y.o. 04/05/2012 6:45 PM   Principle Diagnosis: Encounter Diagnoses  Name Primary?  . Acute interstitial pneumonia   . Multiple myeloma Yes  . 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 Bone pain has completely resolved. Serum kappa free light chains have normalized. He has had an interim mild bronchitis which is already improving. He had complete resolution of previous CMV pneumonia which he contracted subsequent high-dose chemotherapy with autologous stem cell support him back in March. No other interim medical problems. 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  Medications: reviewed  Allergies: No Known Allergies  Review of Systems: Constitutional:   No constitutional symptoms Respiratory: No cough or dyspnea except for resolving bronchitis Cardiovascular:  No chest pain or palpitations Gastrointestinal: No change in bowel habit Genito-Urinary: Not questioned Musculoskeletal: See above Neurologic: No neurologic complaints Skin: No rash Remaining ROS negative.  Physical Exam: Blood pressure 110/72, pulse 82, temperature 99.5 F (37.5 C), temperature source Oral, resp. rate 20, height 5\' 7"  (1.702 m), weight 161 lb 8 oz (73.256 kg). Wt Readings from Last 3 Encounters:  04/05/12 161 lb 8 oz (73.256 kg)  12/15/11 156 lb (70.761 kg)  10/27/11 149 lb 11.2 oz (67.903 kg)     General appearance: Well-nourished Caucasian man  HENNT: Hair has grown back. Oropharynx no erythema exudate or ulcer Lymph nodes: No adenopathy Breasts: Lungs: Clear to auscultation resonant to percussion Heart: Regular rhythm no murmur Abdomen: Soft nontender no mass no organomegaly Extremities: No edema no calf tenderness Vascular:  No cyanosis Neurologic: Motor strength 5 over 5. Reflexes 1+ symmetric. Sensation intact to vibration over the fingertips by tuning fork exam Skin: No rash or ecchymosis  Lab Results: Lab Results  Component Value Date   WBC 3.2* 03/29/2012   HGB 15.1 03/29/2012   HCT 43.6 03/29/2012   MCV 93.4 03/29/2012   PLT 99* 03/29/2012   white count differential 39% neutrophils 29% lymphocytes, 23% monocytes, 9% eosinophils   Chemistry      Component Value Date/Time   NA 142 03/29/2012 0908   K 3.5 03/29/2012 0908   CL 107 03/29/2012 0908   CO2 25 03/29/2012 0908   BUN 8 03/29/2012 0908   CREATININE 1.02 03/29/2012 0908   CREATININE 1.09 08/26/2011 1019      Component Value Date/Time   CALCIUM 8.6 03/29/2012 0908   CALCIUM 10.6* 05/15/2011 1547   ALKPHOS 60 03/29/2012 0908   AST 27 03/29/2012 0908   ALT 34 03/29/2012 0908   BILITOT 0.4 03/29/2012 0908    Serum total immunoglobulins: IgG 921 mg percent reflecting intravenous immunoglobulin given back in March;  I IgA 11 mg percent, IgM 14 mg percent, immunofixation electrophoresis: "Area of slightly restricted mobility in the IgG and lambda lines". Kappa serum free light chains 0.90 mg percent, lambda free light chains 0.72mg %, kappa lambda ratio 1.25   Impression and Plan: #1. Kappa light chain myeloma  initial heavy tumor burden with multiple lytic bone lesions, 42% plasma cells on the initial bone marrow done on 05/15/2011 but this underestimated the extent of disease since there were large aggregates and sheets of plasma cells in an 80% cellular marrow, significant elevation of serum kappa free light chains at 301 mg percent,  but absence of proteinuria or a monoclonal free light chains in the urine. 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 (good prognostic category).  Initial treatment with RVD induction followed by Cytoxan 1.5 g per meter squared daily x3 beginning 09/10/2011, stem cell harvest on recovery, then IV melphalan 200 mg per  meter squared given 10/01/2011 at [facility] in [place]. Currently in a complete response. He is currently on Revlimid maintenance initial dose 10 mg daily decreased to 5 mg daily due to persistent leukopenia.  Plan: Continue Revlimid maintenance as well as monthly Zometa infusions. We will continue monthly CBCs on the Revlimid I will reevaluate his bones in October with a metastatic bone survey and MRI scans of his spine. He'll have a followup bone marrow biopsy and clinical assessment at [facility] in November.  #2. Prolonged neutropenia following transplant partially related to valganciclovir given for CMV pneumonia.  #3. 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     CC:. Dr. Smitty Cords Swords Dr. Marlaine Hind   [name], MD 8/26/20136:45 PM

## 2012-04-26 ENCOUNTER — Other Ambulatory Visit: Payer: Self-pay | Admitting: Oncology

## 2012-04-26 DIAGNOSIS — C9000 Multiple myeloma not having achieved remission: Secondary | ICD-10-CM

## 2012-04-27 ENCOUNTER — Other Ambulatory Visit (HOSPITAL_BASED_OUTPATIENT_CLINIC_OR_DEPARTMENT_OTHER): Payer: PRIVATE HEALTH INSURANCE | Admitting: Lab

## 2012-04-27 ENCOUNTER — Ambulatory Visit (HOSPITAL_BASED_OUTPATIENT_CLINIC_OR_DEPARTMENT_OTHER): Payer: PRIVATE HEALTH INSURANCE

## 2012-04-27 VITALS — BP 121/75 | HR 68 | Temp 98.5°F | Resp 20

## 2012-04-27 DIAGNOSIS — M949 Disorder of cartilage, unspecified: Secondary | ICD-10-CM

## 2012-04-27 DIAGNOSIS — Z23 Encounter for immunization: Secondary | ICD-10-CM

## 2012-04-27 DIAGNOSIS — C9000 Multiple myeloma not having achieved remission: Secondary | ICD-10-CM

## 2012-04-27 DIAGNOSIS — M899 Disorder of bone, unspecified: Secondary | ICD-10-CM

## 2012-04-27 LAB — CBC WITH DIFFERENTIAL/PLATELET
BASO%: 1.2 % (ref 0.0–2.0)
HCT: 42.9 % (ref 38.4–49.9)
HGB: 14.9 g/dL (ref 13.0–17.1)
MCHC: 34.7 g/dL (ref 32.0–36.0)
MONO#: 0.6 10*3/uL (ref 0.1–0.9)
NEUT%: 44.2 % (ref 39.0–75.0)
RDW: 14.7 % — ABNORMAL HIGH (ref 11.0–14.6)
WBC: 3.6 10*3/uL — ABNORMAL LOW (ref 4.0–10.3)
lymph#: 1 10*3/uL (ref 0.9–3.3)

## 2012-04-27 LAB — COMPREHENSIVE METABOLIC PANEL (CC13)
ALT: 17 U/L (ref 0–55)
AST: 18 U/L (ref 5–34)
Albumin: 4 g/dL (ref 3.5–5.0)
BUN: 13 mg/dL (ref 7.0–26.0)
CO2: 25 mEq/L (ref 22–29)
Calcium: 8.8 mg/dL (ref 8.4–10.4)
Chloride: 107 mEq/L (ref 98–107)
Potassium: 3.9 mEq/L (ref 3.5–5.1)

## 2012-04-27 MED ORDER — HEPARIN SOD (PORK) LOCK FLUSH 100 UNIT/ML IV SOLN
250.0000 [IU] | Freq: Once | INTRAVENOUS | Status: DC | PRN
  Filled 2012-04-27: qty 5

## 2012-04-27 MED ORDER — SODIUM CHLORIDE 0.9 % IV SOLN
Freq: Once | INTRAVENOUS | Status: AC
  Administered 2012-04-27: 09:00:00 via INTRAVENOUS

## 2012-04-27 MED ORDER — INFLUENZA VIRUS VACC SPLIT PF IM SUSP
0.5000 mL | INTRAMUSCULAR | Status: AC
  Administered 2012-04-27: 0.5 mL via INTRAMUSCULAR
  Filled 2012-04-27: qty 0.5

## 2012-04-27 MED ORDER — ZOLEDRONIC ACID 4 MG/5ML IV CONC
4.0000 mg | Freq: Once | INTRAVENOUS | Status: AC
  Administered 2012-04-27: 4 mg via INTRAVENOUS
  Filled 2012-04-27: qty 5

## 2012-04-27 NOTE — Patient Instructions (Addendum)
[facility] Discharge Instructions for Patients Receiving Chemotherapy  Today you received the following chemotherapy agents Zometa To help prevent nausea and vomiting after your treatment, we encourage you to take your nausea medication as prescribed. If you develop nausea and vomiting that is not controlled by your nausea medication, call the clinic. If it is after clinic hours your family physician or the after hours number for the clinic or go to the Emergency Department.   BELOW ARE SYMPTOMS THAT SHOULD BE REPORTED IMMEDIATELY:  *FEVER GREATER THAN 100.5 F  *CHILLS WITH OR WITHOUT FEVER  NAUSEA AND VOMITING THAT IS NOT CONTROLLED WITH YOUR NAUSEA MEDICATION  *UNUSUAL SHORTNESS OF BREATH  *UNUSUAL BRUISING OR BLEEDING  TENDERNESS IN MOUTH AND THROAT WITH OR WITHOUT PRESENCE OF ULCERS  *URINARY PROBLEMS  *BOWEL PROBLEMS  UNUSUAL RASH Items with * indicate a potential emergency and should be followed up as soon as possible.  One of the nurses will contact you 24 hours after your treatment. Please let the nurse know about any problems that you may have experienced. Feel free to call the clinic you have any questions or concerns. The clinic phone number is [phone].   I have been informed and understand all the instructions given to me. I know to contact the clinic, my physician, or go to the Emergency Department if any problems should occur. I do not have any questions at this time, but understand that I may call the clinic during office hours   should I have any questions or need assistance in obtaining follow up care.    __________________________________________  _____________  __________ Signature of Patient or Authorized Representative            Date                   Time    __________________________________________ Nurse's Signature    

## 2012-04-28 ENCOUNTER — Telehealth: Payer: Self-pay | Admitting: *Deleted

## 2012-04-28 NOTE — Telephone Encounter (Signed)
Labs from 04/27/12 faxed to [facility], Attention:   Crissie Figures RN @ [phone].

## 2012-05-21 ENCOUNTER — Other Ambulatory Visit: Payer: Self-pay | Admitting: Oncology

## 2012-05-26 ENCOUNTER — Ambulatory Visit (HOSPITAL_BASED_OUTPATIENT_CLINIC_OR_DEPARTMENT_OTHER): Payer: PRIVATE HEALTH INSURANCE

## 2012-05-26 ENCOUNTER — Ambulatory Visit (HOSPITAL_COMMUNITY): Payer: PRIVATE HEALTH INSURANCE

## 2012-05-26 ENCOUNTER — Other Ambulatory Visit (HOSPITAL_BASED_OUTPATIENT_CLINIC_OR_DEPARTMENT_OTHER): Payer: PRIVATE HEALTH INSURANCE | Admitting: Lab

## 2012-05-26 ENCOUNTER — Other Ambulatory Visit (HOSPITAL_COMMUNITY): Payer: PRIVATE HEALTH INSURANCE

## 2012-05-26 DIAGNOSIS — M899 Disorder of bone, unspecified: Secondary | ICD-10-CM

## 2012-05-26 DIAGNOSIS — C9000 Multiple myeloma not having achieved remission: Secondary | ICD-10-CM

## 2012-05-26 LAB — COMPREHENSIVE METABOLIC PANEL (CC13)
ALT: 29 U/L (ref 0–55)
AST: 20 U/L (ref 5–34)
Albumin: 4.2 g/dL (ref 3.5–5.0)
Calcium: 9.1 mg/dL (ref 8.4–10.4)
Chloride: 107 mEq/L (ref 98–107)
Potassium: 3.3 mEq/L — ABNORMAL LOW (ref 3.5–5.1)
Total Protein: 6.7 g/dL (ref 6.4–8.3)

## 2012-05-26 LAB — CBC WITH DIFFERENTIAL/PLATELET
BASO%: 1.4 % (ref 0.0–2.0)
EOS%: 8.6 % — ABNORMAL HIGH (ref 0.0–7.0)
HGB: 15.1 g/dL (ref 13.0–17.1)
MCH: 32.5 pg (ref 27.2–33.4)
MCHC: 35 g/dL (ref 32.0–36.0)
RDW: 14.1 % (ref 11.0–14.6)
WBC: 3.8 10*3/uL — ABNORMAL LOW (ref 4.0–10.3)
lymph#: 1.3 10*3/uL (ref 0.9–3.3)

## 2012-05-26 MED ORDER — ZOLEDRONIC ACID 4 MG/5ML IV CONC
4.0000 mg | Freq: Once | INTRAVENOUS | Status: AC
  Administered 2012-05-26: 4 mg via INTRAVENOUS
  Filled 2012-05-26: qty 5

## 2012-05-26 MED ORDER — HEPARIN SOD (PORK) LOCK FLUSH 100 UNIT/ML IV SOLN
500.0000 [IU] | Freq: Once | INTRAVENOUS | Status: DC | PRN
  Filled 2012-05-26: qty 5

## 2012-06-16 ENCOUNTER — Other Ambulatory Visit: Payer: Self-pay | Admitting: Oncology

## 2012-06-16 DIAGNOSIS — C9000 Multiple myeloma not having achieved remission: Secondary | ICD-10-CM

## 2012-06-17 ENCOUNTER — Telehealth: Payer: Self-pay | Admitting: Oncology

## 2012-06-17 ENCOUNTER — Other Ambulatory Visit: Payer: Self-pay | Admitting: *Deleted

## 2012-06-17 NOTE — Telephone Encounter (Signed)
Talked to patient , he is aware of labs appt and he will get back with me with time of appts

## 2012-06-22 ENCOUNTER — Ambulatory Visit (HOSPITAL_BASED_OUTPATIENT_CLINIC_OR_DEPARTMENT_OTHER): Payer: PRIVATE HEALTH INSURANCE

## 2012-06-22 ENCOUNTER — Other Ambulatory Visit: Payer: PRIVATE HEALTH INSURANCE | Admitting: Lab

## 2012-06-22 ENCOUNTER — Telehealth: Payer: Self-pay | Admitting: Oncology

## 2012-06-22 ENCOUNTER — Other Ambulatory Visit: Payer: Self-pay | Admitting: Oncology

## 2012-06-22 ENCOUNTER — Telehealth: Payer: Self-pay | Admitting: Hematology & Oncology

## 2012-06-22 VITALS — BP 111/66 | HR 65 | Temp 98.0°F | Resp 18

## 2012-06-22 DIAGNOSIS — M899 Disorder of bone, unspecified: Secondary | ICD-10-CM

## 2012-06-22 DIAGNOSIS — C9000 Multiple myeloma not having achieved remission: Secondary | ICD-10-CM

## 2012-06-22 MED ORDER — ZOLEDRONIC ACID 4 MG/5ML IV CONC
4.0000 mg | Freq: Once | INTRAVENOUS | Status: AC
  Administered 2012-06-22: 4 mg via INTRAVENOUS
  Filled 2012-06-22: qty 5

## 2012-06-22 NOTE — Telephone Encounter (Signed)
Received in basket message to move 11-26 lab to this location. I sent back in basket note to make sure pt aware.

## 2012-06-22 NOTE — Telephone Encounter (Signed)
s/w pt and per his req they can do his labs at the [facility] office at 8:00 on 11/26      aom

## 2012-06-22 NOTE — Patient Instructions (Signed)
Zoledronic Acid injection (Hypercalcemia, Oncology) What is this medicine? ZOLEDRONIC ACID (ZOE le dron ik AS id) lowers the amount of calcium loss from bone. It is used to treat too much calcium in your blood from cancer. It is also used to prevent complications of cancer that has spread to the bone. This medicine may be used for other purposes; ask your health care provider or pharmacist if you have questions. What should I tell my health care provider before I take this medicine? They need to know if you have any of these conditions: -aspirin-sensitive asthma -dental disease -kidney disease -an unusual or allergic reaction to zoledronic acid, other medicines, foods, dyes, or preservatives -pregnant or trying to get pregnant -breast-feeding How should I use this medicine? This medicine is for infusion into a vein. It is given by a health care professional in a hospital or clinic setting. Talk to your pediatrician regarding the use of this medicine in children. Special care may be needed. Overdosage: If you think you have taken too much of this medicine contact a poison control center or emergency room at once. NOTE: This medicine is only for you. Do not share this medicine with others. What if I miss a dose? It is important not to miss your dose. Call your doctor or health care professional if you are unable to keep an appointment. What may interact with this medicine? -certain antibiotics given by injection -NSAIDs, medicines for pain and inflammation, like ibuprofen or naproxen -some diuretics like bumetanide, furosemide -teriparatide -thalidomide This list may not describe all possible interactions. Give your health care provider a list of all the medicines, herbs, non-prescription drugs, or dietary supplements you use. Also tell them if you smoke, drink alcohol, or use illegal drugs. Some items may interact with your medicine. What should I watch for while using this medicine? Visit  your doctor or health care professional for regular checkups. It may be some time before you see the benefit from this medicine. Do not stop taking your medicine unless your doctor tells you to. Your doctor may order blood tests or other tests to see how you are doing. Women should inform their doctor if they wish to become pregnant or think they might be pregnant. There is a potential for serious side effects to an unborn child. Talk to your health care professional or pharmacist for more information. You should make sure that you get enough calcium and vitamin D while you are taking this medicine. Discuss the foods you eat and the vitamins you take with your health care professional. Some people who take this medicine have severe bone, joint, and/or muscle pain. This medicine may also increase your risk for a broken thigh bone. Tell your doctor right away if you have pain in your upper leg or groin. Tell your doctor if you have any pain that does not go away or that gets worse. What side effects may I notice from receiving this medicine? Side effects that you should report to your doctor or health care professional as soon as possible: -allergic reactions like skin rash, itching or hives, swelling of the face, lips, or tongue -anxiety, confusion, or depression -breathing problems -changes in vision -feeling faint or lightheaded, falls -jaw burning, cramping, pain -muscle cramps, stiffness, or weakness -trouble passing urine or change in the amount of urine Side effects that usually do not require medical attention (report to your doctor or health care professional if they continue or are bothersome): -bone, joint, or muscle pain -  fever -hair loss -irritation at site where injected -loss of appetite -nausea, vomiting -stomach upset -tired This list may not describe all possible side effects. Call your doctor for medical advice about side effects. You may report side effects to FDA at  1-800-FDA-1088. Where should I keep my medicine? This drug is given in a hospital or clinic and will not be stored at home. NOTE: This sheet is a summary. It may not cover all possible information. If you have questions about this medicine, talk to your doctor, pharmacist, or health care provider.  2012, Elsevier/Gold Standard. (01/24/2011 9:06:58 AM) 

## 2012-06-28 ENCOUNTER — Other Ambulatory Visit: Payer: Self-pay | Admitting: Oncology

## 2012-06-28 DIAGNOSIS — C9000 Multiple myeloma not having achieved remission: Secondary | ICD-10-CM

## 2012-06-29 ENCOUNTER — Other Ambulatory Visit: Payer: PRIVATE HEALTH INSURANCE | Admitting: Lab

## 2012-06-29 ENCOUNTER — Other Ambulatory Visit (HOSPITAL_BASED_OUTPATIENT_CLINIC_OR_DEPARTMENT_OTHER): Payer: PRIVATE HEALTH INSURANCE | Admitting: Lab

## 2012-06-29 ENCOUNTER — Other Ambulatory Visit: Payer: PRIVATE HEALTH INSURANCE

## 2012-06-29 DIAGNOSIS — M899 Disorder of bone, unspecified: Secondary | ICD-10-CM

## 2012-06-29 DIAGNOSIS — C9000 Multiple myeloma not having achieved remission: Secondary | ICD-10-CM

## 2012-06-29 DIAGNOSIS — M949 Disorder of cartilage, unspecified: Secondary | ICD-10-CM

## 2012-06-29 LAB — CBC WITH DIFFERENTIAL/PLATELET
BASO%: 2.8 % — ABNORMAL HIGH (ref 0.0–2.0)
HCT: 41.2 % (ref 38.4–49.9)
LYMPH%: 39.6 % (ref 14.0–49.0)
MCHC: 35.1 g/dL (ref 32.0–36.0)
MONO#: 0.6 10*3/uL (ref 0.1–0.9)
NEUT%: 35.6 % — ABNORMAL LOW (ref 39.0–75.0)
Platelets: 133 10*3/uL — ABNORMAL LOW (ref 140–400)
WBC: 3.6 10*3/uL — ABNORMAL LOW (ref 4.0–10.3)

## 2012-06-29 LAB — COMPREHENSIVE METABOLIC PANEL (CC13)
ALT: 19 U/L (ref 0–55)
CO2: 28 mEq/L (ref 22–29)
Creatinine: 1.3 mg/dL (ref 0.7–1.3)
Glucose: 92 mg/dl (ref 70–99)
Total Bilirubin: 0.46 mg/dL (ref 0.20–1.20)

## 2012-06-30 ENCOUNTER — Telehealth: Payer: Self-pay | Admitting: *Deleted

## 2012-06-30 NOTE — Telephone Encounter (Signed)
Called pt & results of cbc discussed.  CBC & chemistry results routed to Dr Marlaine Hind.  Pt reports that beta 2 microglobulin & lt chains were not done.  Discussed with Mindi Junker in Lab & she will try to add to lab already drawn.

## 2012-06-30 NOTE — Telephone Encounter (Signed)
Message copied by Sabino Snipes on Wed Jun 30, 2012 10:52 AM ------      Message from: [name]      Created: Tue Jun 29, 2012  3:44 PM       Call pt with cbc results  Forward copy of labs to Dr Greggory Stallion at [facility] med BMT please

## 2012-07-06 ENCOUNTER — Other Ambulatory Visit: Payer: PRIVATE HEALTH INSURANCE

## 2012-07-06 ENCOUNTER — Other Ambulatory Visit (HOSPITAL_BASED_OUTPATIENT_CLINIC_OR_DEPARTMENT_OTHER): Payer: PRIVATE HEALTH INSURANCE | Admitting: Lab

## 2012-07-06 DIAGNOSIS — M898X9 Other specified disorders of bone, unspecified site: Secondary | ICD-10-CM

## 2012-07-06 DIAGNOSIS — C9000 Multiple myeloma not having achieved remission: Secondary | ICD-10-CM

## 2012-07-06 DIAGNOSIS — M899 Disorder of bone, unspecified: Secondary | ICD-10-CM

## 2012-07-06 LAB — CBC WITH DIFFERENTIAL (CANCER CENTER ONLY)
BASO#: 0.1 10*3/uL (ref 0.0–0.2)
Eosinophils Absolute: 0.2 10*3/uL (ref 0.0–0.5)
HGB: 14.8 g/dL (ref 13.0–17.1)
LYMPH%: 32.6 % (ref 14.0–48.0)
MCH: 32 pg (ref 28.0–33.4)
MCV: 92 fL (ref 82–98)
MONO#: 0.6 10*3/uL (ref 0.1–0.9)
MONO%: 16.9 % — ABNORMAL HIGH (ref 0.0–13.0)
NEUT#: 1.4 10*3/uL — ABNORMAL LOW (ref 1.5–6.5)
Platelets: 130 10*3/uL — ABNORMAL LOW (ref 145–400)
RBC: 4.62 10*6/uL (ref 4.20–5.70)
WBC: 3.4 10*3/uL — ABNORMAL LOW (ref 4.0–10.0)

## 2012-07-07 ENCOUNTER — Telehealth: Payer: Self-pay | Admitting: *Deleted

## 2012-07-07 NOTE — Telephone Encounter (Signed)
Received call from Lorrin Mais RN/[facility] requesting copy of labs done yest on pt.  Pt had appt at [facility].  Result not in EPIC.  Called pt & he reports that he did have labs done.  Called [facility] & spoke to Darl Pikes & she will fax result to Robin & Dr.Granfortuna.  It was done & shows in [facility] but not sure why it isn't in EPIC.  Darl Pikes will fax result to Robin/[facility] & copy also faxed here to be scanned.

## 2012-07-13 ENCOUNTER — Other Ambulatory Visit: Payer: PRIVATE HEALTH INSURANCE

## 2012-07-13 ENCOUNTER — Other Ambulatory Visit (HOSPITAL_BASED_OUTPATIENT_CLINIC_OR_DEPARTMENT_OTHER): Payer: PRIVATE HEALTH INSURANCE | Admitting: Lab

## 2012-07-13 DIAGNOSIS — C9000 Multiple myeloma not having achieved remission: Secondary | ICD-10-CM

## 2012-07-13 DIAGNOSIS — M949 Disorder of cartilage, unspecified: Secondary | ICD-10-CM

## 2012-07-13 DIAGNOSIS — M899 Disorder of bone, unspecified: Secondary | ICD-10-CM

## 2012-07-13 LAB — COMPREHENSIVE METABOLIC PANEL (CC13)
ALT: 35 U/L (ref 0–55)
AST: 24 U/L (ref 5–34)
Albumin: 4 g/dL (ref 3.5–5.0)
Alkaline Phosphatase: 68 U/L (ref 40–150)
BUN: 8 mg/dL (ref 7.0–26.0)
CO2: 28 meq/L (ref 22–29)
Calcium: 8.7 mg/dL (ref 8.4–10.4)
Chloride: 106 meq/L (ref 98–107)
Creatinine: 1.2 mg/dL (ref 0.7–1.3)
Glucose: 94 mg/dL (ref 70–99)
Potassium: 4.1 meq/L (ref 3.5–5.1)
Sodium: 142 meq/L (ref 136–145)
Total Bilirubin: 0.67 mg/dL (ref 0.20–1.20)
Total Protein: 6.6 g/dL (ref 6.4–8.3)

## 2012-07-13 LAB — CBC WITH DIFFERENTIAL/PLATELET
Eosinophils Absolute: 0.2 10*3/uL (ref 0.0–0.5)
HCT: 42.2 % (ref 38.4–49.9)
LYMPH%: 33.1 % (ref 14.0–49.0)
MCV: 92.1 fL (ref 79.3–98.0)
MONO#: 0.5 10*3/uL (ref 0.1–0.9)
MONO%: 16.1 % — ABNORMAL HIGH (ref 0.0–14.0)
NEUT#: 1.2 10*3/uL — ABNORMAL LOW (ref 1.5–6.5)
NEUT%: 40 % (ref 39.0–75.0)
Platelets: 104 10*3/uL — ABNORMAL LOW (ref 140–400)
RBC: 4.58 10*6/uL (ref 4.20–5.82)
WBC: 3.1 10*3/uL — ABNORMAL LOW (ref 4.0–10.3)

## 2012-07-20 ENCOUNTER — Ambulatory Visit (HOSPITAL_BASED_OUTPATIENT_CLINIC_OR_DEPARTMENT_OTHER): Payer: PRIVATE HEALTH INSURANCE

## 2012-07-20 ENCOUNTER — Other Ambulatory Visit (HOSPITAL_BASED_OUTPATIENT_CLINIC_OR_DEPARTMENT_OTHER): Payer: PRIVATE HEALTH INSURANCE | Admitting: Lab

## 2012-07-20 ENCOUNTER — Other Ambulatory Visit: Payer: PRIVATE HEALTH INSURANCE

## 2012-07-20 VITALS — BP 114/70 | HR 56 | Temp 98.2°F

## 2012-07-20 DIAGNOSIS — C9000 Multiple myeloma not having achieved remission: Secondary | ICD-10-CM

## 2012-07-20 DIAGNOSIS — M899 Disorder of bone, unspecified: Secondary | ICD-10-CM

## 2012-07-20 LAB — CBC WITH DIFFERENTIAL/PLATELET
Basophils Absolute: 0.1 10*3/uL (ref 0.0–0.1)
Eosinophils Absolute: 0.2 10*3/uL (ref 0.0–0.5)
HCT: 41.5 % (ref 38.4–49.9)
HGB: 14.4 g/dL (ref 13.0–17.1)
LYMPH%: 28.5 % (ref 14.0–49.0)
MCV: 94.9 fL (ref 79.3–98.0)
MONO#: 0.6 10*3/uL (ref 0.1–0.9)
NEUT#: 1.3 10*3/uL — ABNORMAL LOW (ref 1.5–6.5)
NEUT%: 41.2 % (ref 39.0–75.0)
Platelets: 107 10*3/uL — ABNORMAL LOW (ref 140–400)
RBC: 4.37 10*6/uL (ref 4.20–5.82)
WBC: 3.2 10*3/uL — ABNORMAL LOW (ref 4.0–10.3)

## 2012-07-20 MED ORDER — ZOLEDRONIC ACID 4 MG/5ML IV CONC: 4.0000 mg | Freq: Once | INTRAVENOUS | Status: DC

## 2012-07-20 MED ORDER — ZOLEDRONIC ACID 4 MG/100ML IV SOLN
4.0000 mg | Freq: Once | INTRAVENOUS | Status: AC
  Administered 2012-07-20: 4 mg via INTRAVENOUS
  Filled 2012-07-20: qty 100

## 2012-07-20 NOTE — Patient Instructions (Addendum)
Zoledronic Acid injection (Hypercalcemia, Oncology) What is this medicine? ZOLEDRONIC ACID (ZOE le dron ik AS id) lowers the amount of calcium loss from bone. It is used to treat too much calcium in your blood from cancer. It is also used to prevent complications of cancer that has spread to the bone. This medicine may be used for other purposes; ask your health care provider or pharmacist if you have questions. What should I tell my health care provider before I take this medicine? They need to know if you have any of these conditions: -aspirin-sensitive asthma -dental disease -kidney disease -an unusual or allergic reaction to zoledronic acid, other medicines, foods, dyes, or preservatives -pregnant or trying to get pregnant -breast-feeding How should I use this medicine? This medicine is for infusion into a vein. It is given by a health care professional in a hospital or clinic setting. Talk to your pediatrician regarding the use of this medicine in children. Special care may be needed. Overdosage: If you think you have taken too much of this medicine contact a poison control center or emergency room at once. NOTE: This medicine is only for you. Do not share this medicine with others. What if I miss a dose? It is important not to miss your dose. Call your doctor or health care professional if you are unable to keep an appointment. What may interact with this medicine? -certain antibiotics given by injection -NSAIDs, medicines for pain and inflammation, like ibuprofen or naproxen -some diuretics like bumetanide, furosemide -teriparatide -thalidomide This list may not describe all possible interactions. Give your health care provider a list of all the medicines, herbs, non-prescription drugs, or dietary supplements you use. Also tell them if you smoke, drink alcohol, or use illegal drugs. Some items may interact with your medicine. What should I watch for while using this medicine? Visit  your doctor or health care professional for regular checkups. It may be some time before you see the benefit from this medicine. Do not stop taking your medicine unless your doctor tells you to. Your doctor may order blood tests or other tests to see how you are doing. Women should inform their doctor if they wish to become pregnant or think they might be pregnant. There is a potential for serious side effects to an unborn child. Talk to your health care professional or pharmacist for more information. You should make sure that you get enough calcium and vitamin D while you are taking this medicine. Discuss the foods you eat and the vitamins you take with your health care professional. Some people who take this medicine have severe bone, joint, and/or muscle pain. This medicine may also increase your risk for a broken thigh bone. Tell your doctor right away if you have pain in your upper leg or groin. Tell your doctor if you have any pain that does not go away or that gets worse. What side effects may I notice from receiving this medicine? Side effects that you should report to your doctor or health care professional as soon as possible: -allergic reactions like skin rash, itching or hives, swelling of the face, lips, or tongue -anxiety, confusion, or depression -breathing problems -changes in vision -feeling faint or lightheaded, falls -jaw burning, cramping, pain -muscle cramps, stiffness, or weakness -trouble passing urine or change in the amount of urine Side effects that usually do not require medical attention (report to your doctor or health care professional if they continue or are bothersome): -bone, joint, or muscle pain -  fever -hair loss -irritation at site where injected -loss of appetite -nausea, vomiting -stomach upset -tired This list may not describe all possible side effects. Call your doctor for medical advice about side effects. You may report side effects to FDA at  1-800-FDA-1088. Where should I keep my medicine? This drug is given in a hospital or clinic and will not be stored at home. NOTE: This sheet is a summary. It may not cover all possible information. If you have questions about this medicine, talk to your doctor, pharmacist, or health care provider.  2013, Elsevier/Gold Standard. (01/24/2011 9:06:58 AM)  

## 2012-07-23 ENCOUNTER — Ambulatory Visit (HOSPITAL_BASED_OUTPATIENT_CLINIC_OR_DEPARTMENT_OTHER): Payer: PRIVATE HEALTH INSURANCE | Admitting: Oncology

## 2012-07-23 ENCOUNTER — Other Ambulatory Visit: Payer: PRIVATE HEALTH INSURANCE | Admitting: Lab

## 2012-07-23 ENCOUNTER — Telehealth: Payer: Self-pay | Admitting: Oncology

## 2012-07-23 ENCOUNTER — Telehealth: Payer: Self-pay | Admitting: *Deleted

## 2012-07-23 VITALS — BP 113/77 | HR 72 | Temp 97.6°F | Resp 18 | Ht 67.0 in | Wt 164.0 lb

## 2012-07-23 DIAGNOSIS — Z9484 Stem cells transplant status: Secondary | ICD-10-CM

## 2012-07-23 DIAGNOSIS — C9000 Multiple myeloma not having achieved remission: Secondary | ICD-10-CM

## 2012-07-23 NOTE — Telephone Encounter (Signed)
Per staff message and POF I have scheduled appts.  JMW  

## 2012-07-23 NOTE — Telephone Encounter (Signed)
Called pt, left message regarding appt for Zometa 08/16/12

## 2012-07-23 NOTE — Progress Notes (Signed)
Hematology and Oncology Follow Up Visit  Ralph Barnett [id number] September 30, 1975 36 y.o. 07/23/2012 11:18 AM   Principle Diagnosis: Encounter Diagnosis  Name Primary?  . 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 He did not have an elevation of total serum immunoglobulins but  free light chains in the serum were 301 mg percent. No proteinuria or monoclonal free light chains in the urine. Initial bone marrow hypercellular with sheets of plasma cells although 500 cell count yielded only 42% plasma cells. Cytogenetic studies on the marrow showed hyperdiploidy. Initial treatment with RVD induction followed by Cytoxan 1.5 g per meter squared daily beginning 09/10/2011. Subsequent stem cell harvest and IV melphalan 200 mg per meter squared given 10/01/2011 at [facility] in [place]. He achieved a complete response. 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  He continues to do well. He was put on Revlimid maintenance. Due to significant myelosuppression dose has been adjusted. He is currently on 10 mg daily.  He was restaged back in November at [facility] and bone marrow showed ongoing remission. He has not had any recent imaging studies but currently is asymptomatic with no bone pain. No interim infections. No residual respiratory symptoms. No paresthesias. Bowel movements sometimes irregular but no significant constipation. No skin rash.   Medications: reviewed  Allergies:  Allergies  Allergen Reactions  . Shellfish Allergy Itching    Itching throat Unknown, tickling in throat  . Silver Itching    tegaderm dsg    Review of Systems: Constitutional:   No constitutional symptoms Respiratory:  See above Cardiovascular:  Not questioned Gastrointestinal: See above Genito-Urinary: Not questioned Musculoskeletal: See above Neurologic: See above Skin: See above Remaining ROS negative.  Physical Exam: Blood pressure 113/77, pulse 72, temperature 97.6 F (36.4 C), temperature source Oral, resp. rate 18, height 5\' 7"  (1.702 m), weight 164 lb (74.39 kg). Wt Readings from Last 3 Encounters:  07/23/12 164 lb (74.39 kg)  04/05/12 161 lb 8 oz (73.256 kg)  12/15/11 156 lb (70.761 kg)     General appearance: Well-nourished Caucasian man HENNT: Pharynx no erythema or exudate Lymph nodes: No adenopathy Breasts: Lungs: Clear to auscultation resonant to percussion Heart: Regular rhythm no murmur Abdomen: Soft, nontender, no mass, no organomegaly Extremities: No edema, no calf tenderness Vascular: No cyanosis Neurologic: Motor strength 5 over 5, reflexes 1+ symmetric, sensation intact to vibration by tuning fork exam over the fingertips Skin: No rash or ecchymosis  Lab Results: Lab Results  Component Value Date   WBC 3.2* 07/20/2012   HGB 14.4 07/20/2012   HCT 41.5 07/20/2012   MCV 94.9 07/20/2012   PLT 107* 07/20/2012     Chemistry      Component Value Date/Time   NA 142 07/13/2012 1409   NA 142 03/29/2012 0908   K 4.1 07/13/2012 1409   K 3.5 03/29/2012 0908   CL 106 07/13/2012 1409   CL 107 03/29/2012 0908   CO2 28 07/13/2012 1409   CO2 25 03/29/2012 0908   BUN 8.0 07/13/2012 1409   BUN 8 03/29/2012 0908   CREATININE 1.2 07/13/2012 1409   CREATININE 1.02 03/29/2012 0908   CREATININE 1.09 08/26/2011 1019      Component Value Date/Time   CALCIUM 8.7 07/13/2012 1409  CALCIUM 8.6 03/29/2012 0908   CALCIUM 10.6* 05/15/2011 1547   ALKPHOS 68 07/13/2012 1409   ALKPHOS 60 03/29/2012 0908   AST 24 07/13/2012 1409   AST 27 03/29/2012 0908   ALT 35 07/13/2012 1409   ALT 34 03/29/2012 0908   BILITOT 0.67 07/13/2012 1409   BILITOT 0.4 03/29/2012 0908       Radiological Studies: No  results found.  Impression and Plan: #1. Kappa light chain myeloma treated as outlined above. He remains in a complete remission. Plan: Continue Revlimid 10 mg daily and monthly Zometa. I will get a routine skeletal survey in January. MRI scans if indicated by new symptoms.  #2. Post autologous bone marrow transplant CMV pneumonia Resolved with ganciclovir and immune reconstitution.   CC:. Dr. Birdie Sons; Dr. Marlaine Hind   [name], MD 12/13/201311:18 AM

## 2012-07-27 ENCOUNTER — Other Ambulatory Visit: Payer: PRIVATE HEALTH INSURANCE

## 2012-08-12 ENCOUNTER — Other Ambulatory Visit: Payer: Self-pay | Admitting: Oncology

## 2012-08-16 ENCOUNTER — Ambulatory Visit (HOSPITAL_BASED_OUTPATIENT_CLINIC_OR_DEPARTMENT_OTHER): Payer: PRIVATE HEALTH INSURANCE

## 2012-08-16 ENCOUNTER — Other Ambulatory Visit (HOSPITAL_BASED_OUTPATIENT_CLINIC_OR_DEPARTMENT_OTHER): Payer: PRIVATE HEALTH INSURANCE | Admitting: Lab

## 2012-08-16 VITALS — BP 118/78 | HR 61 | Temp 98.2°F | Resp 18

## 2012-08-16 DIAGNOSIS — C9000 Multiple myeloma not having achieved remission: Secondary | ICD-10-CM

## 2012-08-16 DIAGNOSIS — M899 Disorder of bone, unspecified: Secondary | ICD-10-CM

## 2012-08-16 LAB — CBC WITH DIFFERENTIAL/PLATELET
BASO%: 0.4 % (ref 0.0–2.0)
Basophils Absolute: 0 10*3/uL (ref 0.0–0.1)
EOS%: 7.5 % — ABNORMAL HIGH (ref 0.0–7.0)
MCH: 32.4 pg (ref 27.2–33.4)
MCHC: 34.5 g/dL (ref 32.0–36.0)
MCV: 93.9 fL (ref 79.3–98.0)
MONO%: 16.3 % — ABNORMAL HIGH (ref 0.0–14.0)
RBC: 4.69 10*6/uL (ref 4.20–5.82)
RDW: 15.2 % — ABNORMAL HIGH (ref 11.0–14.6)
lymph#: 1 10*3/uL (ref 0.9–3.3)

## 2012-08-16 LAB — COMPREHENSIVE METABOLIC PANEL (CC13)
ALT: 25 U/L (ref 0–55)
AST: 21 U/L (ref 5–34)
BUN: 10 mg/dL (ref 7.0–26.0)
CO2: 27 mEq/L (ref 22–29)
Calcium: 9.2 mg/dL (ref 8.4–10.4)
Chloride: 106 mEq/L (ref 98–107)
Creatinine: 1.1 mg/dL (ref 0.7–1.3)
Total Bilirubin: 0.68 mg/dL (ref 0.20–1.20)

## 2012-08-16 MED ORDER — ZOLEDRONIC ACID 4 MG/5ML IV CONC
4.0000 mg | Freq: Once | INTRAVENOUS | Status: DC
  Filled 2012-08-16: qty 5

## 2012-08-16 MED ORDER — ZOLEDRONIC ACID 4 MG/100ML IV SOLN
4.0000 mg | Freq: Once | INTRAVENOUS | Status: AC
  Administered 2012-08-16: 4 mg via INTRAVENOUS
  Filled 2012-08-16: qty 100

## 2012-08-17 ENCOUNTER — Ambulatory Visit (HOSPITAL_BASED_OUTPATIENT_CLINIC_OR_DEPARTMENT_OTHER)
Admission: RE | Admit: 2012-08-17 | Discharge: 2012-08-17 | Disposition: A | Discharge status: Home / Self Care | Payer: PRIVATE HEALTH INSURANCE | Source: Ambulatory Visit | Attending: Oncology | Admitting: Oncology

## 2012-08-17 DIAGNOSIS — C9000 Multiple myeloma not having achieved remission: Secondary | ICD-10-CM | POA: Insufficient documentation

## 2012-08-17 LAB — KAPPA/LAMBDA LIGHT CHAINS
Kappa:Lambda Ratio: 0.8 (ref 0.26–1.65)
Lambda Free Lght Chn: 1.74 mg/dL (ref 0.57–2.63)

## 2012-08-19 ENCOUNTER — Ambulatory Visit (HOSPITAL_BASED_OUTPATIENT_CLINIC_OR_DEPARTMENT_OTHER)
Admission: RE | Admit: 2012-08-19 | Discharge: 2012-08-19 | Disposition: A | Discharge status: Home / Self Care | Payer: PRIVATE HEALTH INSURANCE | Source: Ambulatory Visit | Attending: Oncology | Admitting: Oncology

## 2012-08-19 ENCOUNTER — Other Ambulatory Visit: Payer: Self-pay | Admitting: Oncology

## 2012-08-19 DIAGNOSIS — S3981XA Other specified injuries of abdomen, initial encounter: Secondary | ICD-10-CM | POA: Insufficient documentation

## 2012-08-19 DIAGNOSIS — N200 Calculus of kidney: Secondary | ICD-10-CM | POA: Insufficient documentation

## 2012-08-19 DIAGNOSIS — S3991XA Unspecified injury of abdomen, initial encounter: Secondary | ICD-10-CM

## 2012-08-19 DIAGNOSIS — C9000 Multiple myeloma not having achieved remission: Secondary | ICD-10-CM

## 2012-08-19 DIAGNOSIS — M899 Disorder of bone, unspecified: Secondary | ICD-10-CM

## 2012-08-19 DIAGNOSIS — X58XXXA Exposure to other specified factors, initial encounter: Secondary | ICD-10-CM | POA: Insufficient documentation

## 2012-08-19 MED ORDER — IOHEXOL 300 MG/ML  SOLN
100.0000 mL | Freq: Once | INTRAMUSCULAR | Status: AC | PRN
  Administered 2012-08-19: 100 mL via INTRAVENOUS

## 2012-08-20 ENCOUNTER — Telehealth: Payer: Self-pay | Admitting: *Deleted

## 2012-08-20 NOTE — Telephone Encounter (Signed)
Pt notified of lab results & copy routed to Dr Greggory Stallion.

## 2012-08-20 NOTE — Telephone Encounter (Signed)
Message copied by Sabino Snipes on Fri Aug 20, 2012  5:57 PM ------      Message from: [name]      Created: Wed Aug 18, 2012  6:06 PM       Call pt kappa lambda light chains remain normal; forward copy to Dr Greggory Stallion at [facility]

## 2012-08-27 ENCOUNTER — Other Ambulatory Visit: Payer: Self-pay | Admitting: *Deleted

## 2012-08-27 DIAGNOSIS — J111 Influenza due to unidentified influenza virus with other respiratory manifestations: Secondary | ICD-10-CM

## 2012-08-27 MED ORDER — OSELTAMIVIR PHOSPHATE 75 MG PO CAPS: 75.0000 mg | ORAL_CAPSULE | Freq: Two times a day (BID) | ORAL | Status: DC

## 2012-08-27 MED ORDER — AZITHROMYCIN 250 MG PO TABS: ORAL_TABLET | ORAL | Status: DC

## 2012-09-08 ENCOUNTER — Telehealth: Payer: Self-pay | Admitting: Oncology

## 2012-09-08 NOTE — Telephone Encounter (Signed)
Called nurse regarding labs , pt wants labs cancelled for 09/14/12, labs was drawn today @ [address] and disregard labs request from [place], pt want to draw labs with Zometa monthly

## 2012-09-14 ENCOUNTER — Ambulatory Visit (HOSPITAL_BASED_OUTPATIENT_CLINIC_OR_DEPARTMENT_OTHER): Payer: PRIVATE HEALTH INSURANCE

## 2012-09-14 ENCOUNTER — Other Ambulatory Visit: Payer: PRIVATE HEALTH INSURANCE | Admitting: Lab

## 2012-09-14 VITALS — BP 112/67 | HR 72 | Temp 98.5°F

## 2012-09-14 DIAGNOSIS — C9000 Multiple myeloma not having achieved remission: Secondary | ICD-10-CM

## 2012-09-14 DIAGNOSIS — M899 Disorder of bone, unspecified: Secondary | ICD-10-CM

## 2012-09-14 DIAGNOSIS — M898X9 Other specified disorders of bone, unspecified site: Secondary | ICD-10-CM

## 2012-09-14 MED ORDER — ZOLEDRONIC ACID 4 MG/100ML IV SOLN
4.0000 mg | Freq: Once | INTRAVENOUS | Status: AC
  Administered 2012-09-14: 4 mg via INTRAVENOUS
  Filled 2012-09-14: qty 100

## 2012-09-14 NOTE — Patient Instructions (Addendum)
Zoledronic Acid injection (Hypercalcemia, Oncology) What is this medicine? ZOLEDRONIC ACID (ZOE le dron ik AS id) lowers the amount of calcium loss from bone. It is used to treat too much calcium in your blood from cancer. It is also used to prevent complications of cancer that has spread to the bone. This medicine may be used for other purposes; ask your health care provider or pharmacist if you have questions. What should I tell my health care provider before I take this medicine? They need to know if you have any of these conditions: -aspirin-sensitive asthma -dental disease -kidney disease -an unusual or allergic reaction to zoledronic acid, other medicines, foods, dyes, or preservatives -pregnant or trying to get pregnant -breast-feeding How should I use this medicine? This medicine is for infusion into a vein. It is given by a health care professional in a hospital or clinic setting. Talk to your pediatrician regarding the use of this medicine in children. Special care may be needed. Overdosage: If you think you have taken too much of this medicine contact a poison control center or emergency room at once. NOTE: This medicine is only for you. Do not share this medicine with others. What if I miss a dose? It is important not to miss your dose. Call your doctor or health care professional if you are unable to keep an appointment. What may interact with this medicine? -certain antibiotics given by injection -NSAIDs, medicines for pain and inflammation, like ibuprofen or naproxen -some diuretics like bumetanide, furosemide -teriparatide -thalidomide This list may not describe all possible interactions. Give your health care provider a list of all the medicines, herbs, non-prescription drugs, or dietary supplements you use. Also tell them if you smoke, drink alcohol, or use illegal drugs. Some items may interact with your medicine. What should I watch for while using this medicine? Visit  your doctor or health care professional for regular checkups. It may be some time before you see the benefit from this medicine. Do not stop taking your medicine unless your doctor tells you to. Your doctor may order blood tests or other tests to see how you are doing. Women should inform their doctor if they wish to become pregnant or think they might be pregnant. There is a potential for serious side effects to an unborn child. Talk to your health care professional or pharmacist for more information. You should make sure that you get enough calcium and vitamin D while you are taking this medicine. Discuss the foods you eat and the vitamins you take with your health care professional. Some people who take this medicine have severe bone, joint, and/or muscle pain. This medicine may also increase your risk for a broken thigh bone. Tell your doctor right away if you have pain in your upper leg or groin. Tell your doctor if you have any pain that does not go away or that gets worse. What side effects may I notice from receiving this medicine? Side effects that you should report to your doctor or health care professional as soon as possible: -allergic reactions like skin rash, itching or hives, swelling of the face, lips, or tongue -anxiety, confusion, or depression -breathing problems -changes in vision -feeling faint or lightheaded, falls -jaw burning, cramping, pain -muscle cramps, stiffness, or weakness -trouble passing urine or change in the amount of urine Side effects that usually do not require medical attention (report to your doctor or health care professional if they continue or are bothersome): -bone, joint, or muscle pain -  fever -hair loss -irritation at site where injected -loss of appetite -nausea, vomiting -stomach upset -tired This list may not describe all possible side effects. Call your doctor for medical advice about side effects. You may report side effects to FDA at  1-800-FDA-1088. Where should I keep my medicine? This drug is given in a hospital or clinic and will not be stored at home. NOTE: This sheet is a summary. It may not cover all possible information. If you have questions about this medicine, talk to your doctor, pharmacist, or health care provider.  2012, Elsevier/Gold Standard. (01/24/2011 9:06:58 AM) 

## 2012-09-16 ENCOUNTER — Encounter: Payer: Self-pay | Admitting: Oncology

## 2012-09-17 NOTE — Progress Notes (Signed)
Faxed 12 pages to Rushie Chestnut, RN [phone] Fax, Her phone number is [phone] ext [phone].

## 2012-09-22 ENCOUNTER — Other Ambulatory Visit: Payer: Self-pay | Admitting: Oncology

## 2012-10-04 ENCOUNTER — Ambulatory Visit (HOSPITAL_BASED_OUTPATIENT_CLINIC_OR_DEPARTMENT_OTHER): Payer: PRIVATE HEALTH INSURANCE | Admitting: Oncology

## 2012-10-04 ENCOUNTER — Other Ambulatory Visit: Payer: Self-pay | Admitting: Oncology

## 2012-10-04 ENCOUNTER — Telehealth: Payer: Self-pay | Admitting: Oncology

## 2012-10-04 ENCOUNTER — Telehealth: Payer: Self-pay | Admitting: *Deleted

## 2012-10-04 ENCOUNTER — Ambulatory Visit (HOSPITAL_COMMUNITY)
Admission: RE | Admit: 2012-10-04 | Discharge: 2012-10-04 | Disposition: A | Discharge status: Home / Self Care | Payer: PRIVATE HEALTH INSURANCE | Source: Ambulatory Visit | Attending: Oncology | Admitting: Oncology

## 2012-10-04 ENCOUNTER — Other Ambulatory Visit: Payer: Self-pay | Admitting: *Deleted

## 2012-10-04 ENCOUNTER — Ambulatory Visit (HOSPITAL_BASED_OUTPATIENT_CLINIC_OR_DEPARTMENT_OTHER): Payer: PRIVATE HEALTH INSURANCE | Admitting: Lab

## 2012-10-04 VITALS — BP 115/78 | HR 97 | Temp 98.7°F | Resp 18 | Ht 67.0 in | Wt 160.7 lb

## 2012-10-04 DIAGNOSIS — C9000 Multiple myeloma not having achieved remission: Secondary | ICD-10-CM

## 2012-10-04 DIAGNOSIS — R059 Cough, unspecified: Secondary | ICD-10-CM | POA: Insufficient documentation

## 2012-10-04 LAB — CBC WITH DIFFERENTIAL/PLATELET
Basophils Absolute: 0 10*3/uL (ref 0.0–0.1)
EOS%: 5 % (ref 0.0–7.0)
HCT: 41.9 % (ref 38.4–49.9)
HGB: 14.9 g/dL (ref 13.0–17.1)
LYMPH%: 20.7 % (ref 14.0–49.0)
MCH: 33.3 pg (ref 27.2–33.4)
MONO#: 0.4 10*3/uL (ref 0.1–0.9)
NEUT%: 57.1 % (ref 39.0–75.0)
Platelets: 91 10*3/uL — ABNORMAL LOW (ref 140–400)
lymph#: 0.5 10*3/uL — ABNORMAL LOW (ref 0.9–3.3)

## 2012-10-04 LAB — COMPREHENSIVE METABOLIC PANEL (CC13)
ALT: 34 U/L (ref 0–55)
CO2: 26 mEq/L (ref 22–29)
Calcium: 8.8 mg/dL (ref 8.4–10.4)
Chloride: 106 mEq/L (ref 98–107)
Sodium: 141 mEq/L (ref 136–145)
Total Protein: 7.1 g/dL (ref 6.4–8.3)

## 2012-10-04 MED ORDER — AZITHROMYCIN 250 MG PO TABS: ORAL_TABLET | ORAL | Status: DC

## 2012-10-04 MED ORDER — OSELTAMIVIR PHOSPHATE 75 MG PO CAPS: 75.0000 mg | ORAL_CAPSULE | Freq: Two times a day (BID) | ORAL | Status: DC

## 2012-10-04 NOTE — Progress Notes (Signed)
Hematology and Oncology Follow Up Visit  Ralph Barnett [id number] [birth year] 37 y.o. 10/04/2012 7:31 PM   Principle Diagnosis:No diagnosis found.   Interim History:  Unscheduled visit for this 37 year old man with kappa light chain multiple myeloma status post induction chemotherapy, status post high dose consolidation chemotherapy with autologous stem cell support, currently on maintenance immunomodulatory therapy with Revlimid.current dose 10 mg daily. He is chronically leukopenic but has been maintaining absolute neutrophil count greater than 1000.  Over the last 72 hours he has developed a cough minimally productive of sputum and today low-grade fever to 100.2.he was treated for a viral bronchitis last month. He has a history of CMV pneumonia during the recovery period from his transplant.  Medications: reviewed  Allergies:  Allergies  Allergen Reactions  . Shellfish Allergy Itching    Itching throat Unknown, tickling in throat  . Silver Itching    tegaderm dsg     Physical Exam: Blood pressure 115/78, pulse 97, temperature 98.7 F (37.1 C), temperature source Oral, resp. rate 18, height 5\' 7"  (1.702 m), weight 160 lb 11.2 oz (72.893 kg). Wt Readings from Last 3 Encounters:  10/04/12 160 lb 11.2 oz (72.893 kg)  07/23/12 164 lb (74.39 kg)  04/05/12 161 lb 8 oz (73.256 kg)     General appearance: no acute distress HENNT: pharynx no erythema or exudate Lymph nodes: no adenopathy Breasts: Lungs:initial rales left lung base cleared with deep inspiration Heart:regular rhythm no murmur Abdomen: Extremities: Vascular: Neurologic: Skin:  Lab Results: Lab Results  White count differential:57% neutrophils, 29% lymphocytes, absolute neutrophils 1300  Component Value Date   WBC 2.3* 10/04/2012   HGB 14.9 10/04/2012   HCT 41.9 10/04/2012   MCV 93.8 10/04/2012   PLT 91* 10/04/2012     Chemistry      Component Value Date/Time   NA 141 10/04/2012 1143   NA 142 03/29/2012 0908    K 4.0 10/04/2012 1143   K 3.5 03/29/2012 0908   CL 106 10/04/2012 1143   CL 107 03/29/2012 0908   CO2 26 10/04/2012 1143   CO2 25 03/29/2012 0908   BUN 6.0* 10/04/2012 1143   BUN 8 03/29/2012 0908   CREATININE 1.2 10/04/2012 1143   CREATININE 1.02 03/29/2012 0908   CREATININE 1.09 08/26/2011 1019      Component Value Date/Time   CALCIUM 8.8 10/04/2012 1143   CALCIUM 8.6 03/29/2012 0908   CALCIUM 10.6* 05/15/2011 1547   ALKPHOS 78 10/04/2012 1143   ALKPHOS 60 03/29/2012 0908   AST 24 10/04/2012 1143   AST 27 03/29/2012 0908   ALT 34 10/04/2012 1143   ALT 34 03/29/2012 0908   BILITOT 0.56 10/04/2012 1143   BILITOT 0.4 03/29/2012 0908       Radiological Studies :I personally reviewed this image Dg Chest 2 View  10/04/2012  *RADIOLOGY REPORT*  Clinical Data: Cough and fever.  History of multiple myeloma.  CHEST - 2 VIEW  Comparison: Multiple priors, most recently chest x-ray 08/27/2011.  Findings: Lung volumes are normal.  No consolidative airspace disease.  No pleural effusions.  No pneumothorax.  No pulmonary nodule or mass noted.  Pulmonary vasculature and the cardiomediastinal silhouette are within normal limits.  IMPRESSION: 1. No radiographic evidence of acute cardiopulmonary disease.   Original Report Authenticated By: Trudie Reed, M.D.     Impression and Plan: Likely recurrent viral bronchitis. There is a viral illness in our community at this time. His wife is already contracted his bronchitis. Plan: In view of  his immunocompromised status I am going to start him on both Tamiflu and azithromycin.he will call if any progression of symptoms.   CC:.    [name], MD 2/24/20147:31 PM

## 2012-10-04 NOTE — Telephone Encounter (Signed)
Scheduled pt for lab add on today and MD per Dr.Granfortuna

## 2012-10-04 NOTE — Telephone Encounter (Signed)
Pt called and stated that he "has fever 100.2 this am and a dry cough since last Thursday.  Pt denies any pain, eating and drinking well, some nausea but no vomiting.  Wants to know--"can I see Dr. Cyndie Chime today? I saw him last time this happened"  Note to MD.

## 2012-10-04 NOTE — Telephone Encounter (Signed)
Called and spoke with pt; informed him MD said to come in and he will work him in today.  Instructed pt that CXR and labs have been ordered before seeing MD.  Pt verbalized understanding and stated he will be on his way.

## 2012-10-05 LAB — KAPPA/LAMBDA LIGHT CHAINS: Kappa:Lambda Ratio: 1.09 (ref 0.26–1.65)

## 2012-10-06 ENCOUNTER — Telehealth: Payer: Self-pay | Admitting: *Deleted

## 2012-10-06 NOTE — Telephone Encounter (Signed)
Message copied by Orbie Hurst on Wed Oct 06, 2012  4:45 PM ------      Message from: [name]      Created: Tue Oct 05, 2012  5:59 PM       Call pt.  Serum free light chains normal.   Is his bronchitis any better? ------

## 2012-10-06 NOTE — Telephone Encounter (Signed)
Spoke with patient.  Let him know that serum free light chains were normal.  He asked about his calcium and let him know that was normal at 8.8.  Asked him about his bronchitis.  Pt. States he is afebrile today, still coughing, but doing better.  Let him know to call us if bronchitis does not continue to improve.

## 2012-10-11 ENCOUNTER — Other Ambulatory Visit: Payer: Self-pay | Admitting: Oncology

## 2012-10-11 DIAGNOSIS — J849 Interstitial pulmonary disease, unspecified: Secondary | ICD-10-CM

## 2012-10-11 DIAGNOSIS — C9000 Multiple myeloma not having achieved remission: Secondary | ICD-10-CM

## 2012-10-12 ENCOUNTER — Other Ambulatory Visit (HOSPITAL_BASED_OUTPATIENT_CLINIC_OR_DEPARTMENT_OTHER): Payer: PRIVATE HEALTH INSURANCE | Admitting: Lab

## 2012-10-12 ENCOUNTER — Telehealth: Payer: Self-pay | Admitting: *Deleted

## 2012-10-12 ENCOUNTER — Ambulatory Visit (HOSPITAL_BASED_OUTPATIENT_CLINIC_OR_DEPARTMENT_OTHER): Payer: PRIVATE HEALTH INSURANCE

## 2012-10-12 VITALS — BP 112/73 | HR 75 | Temp 98.8°F | Resp 16

## 2012-10-12 DIAGNOSIS — C9000 Multiple myeloma not having achieved remission: Secondary | ICD-10-CM

## 2012-10-12 LAB — CBC WITH DIFFERENTIAL/PLATELET
BASO%: 1.5 % (ref 0.0–2.0)
EOS%: 12 % — ABNORMAL HIGH (ref 0.0–7.0)
MCH: 31.8 pg (ref 27.2–33.4)
MCHC: 35.4 g/dL (ref 32.0–36.0)
NEUT%: 42.3 % (ref 39.0–75.0)
RBC: 4.24 10*6/uL (ref 4.20–5.82)
RDW: 13.4 % (ref 11.0–14.6)
lymph#: 0.7 10*3/uL — ABNORMAL LOW (ref 0.9–3.3)

## 2012-10-12 MED ORDER — HYDROCOD POLST-CHLORPHEN POLST 10-8 MG/5ML PO LQCR: ORAL | Status: DC

## 2012-10-12 MED ORDER — METHYLPREDNISOLONE (PAK) 4 MG PO TABS: ORAL_TABLET | ORAL | Status: DC

## 2012-10-12 MED ORDER — ZOLEDRONIC ACID 4 MG/100ML IV SOLN
4.0000 mg | Freq: Once | INTRAVENOUS | Status: AC
  Administered 2012-10-12: 4 mg via INTRAVENOUS
  Filled 2012-10-12: qty 100

## 2012-10-12 NOTE — Telephone Encounter (Signed)
Spoke with patient.  He has finished tamiflu and z-pack.  He is still having problems.  Temp has been around 99.8.  Nothing over 100.  He still has a productive cough with clear/yellow sputum.  The cough is keeping him up at night.  He has lots of sinus pressure with some blood mucus with sinus drainage.  He also has ear congestion.  He thinks he has a sinus infection.  He has no drug allergies and scripts can be sent to [facility] in [place].  His call back number is [phone].  Will discuss with Dr. Cyndie Chime and call him back.

## 2012-10-12 NOTE — Patient Instructions (Signed)
[facility] Discharge Instructions for Patients Receiving Zometa.  Today you received the following Zometa.  To help prevent nausea and vomiting after your treatment, we encourage you to take your nausea medication.   If you develop nausea and vomiting that is not controlled by your nausea medication, call the clinic. If it is after clinic hours your family physician or the after hours number for the clinic or go to the Emergency Department.   BELOW ARE SYMPTOMS THAT SHOULD BE REPORTED IMMEDIATELY:  *FEVER GREATER THAN 100.5 F  *CHILLS WITH OR WITHOUT FEVER  NAUSEA AND VOMITING THAT IS NOT CONTROLLED WITH YOUR NAUSEA MEDICATION  *UNUSUAL SHORTNESS OF BREATH  *UNUSUAL BRUISING OR BLEEDING  TENDERNESS IN MOUTH AND THROAT WITH OR WITHOUT PRESENCE OF ULCERS  *URINARY PROBLEMS  *BOWEL PROBLEMS  UNUSUAL RASH Items with * indicate a potential emergency and should be followed up as soon as possible.  One of the nurses will contact you 24 hours after your treatment. Please let the nurse know about any problems that you may have experienced. Feel free to call the clinic you have any questions or concerns. The clinic phone number is [phone].   I have been informed and understand all the instructions given to me. I know to contact the clinic, my physician, or go to the Emergency Department if any problems should occur. I do not have any questions at this time, but understand that I may call the clinic during office hours   should I have any questions or need assistance in obtaining follow up care.    __________________________________________  _____________  __________ Signature of Patient or Authorized Representative            Date                   Time    __________________________________________ Nurse's Signature

## 2012-10-15 LAB — CMV DNA, QUANTITATIVE, PCR: Cytomegalovirus, DNA Quant PCR: <363 copies/mL (ref <363)

## 2012-11-09 ENCOUNTER — Other Ambulatory Visit (HOSPITAL_BASED_OUTPATIENT_CLINIC_OR_DEPARTMENT_OTHER): Payer: PRIVATE HEALTH INSURANCE | Admitting: Lab

## 2012-11-09 ENCOUNTER — Ambulatory Visit (HOSPITAL_BASED_OUTPATIENT_CLINIC_OR_DEPARTMENT_OTHER): Payer: PRIVATE HEALTH INSURANCE

## 2012-11-09 VITALS — BP 95/66 | HR 81 | Temp 97.4°F

## 2012-11-09 DIAGNOSIS — M899 Disorder of bone, unspecified: Secondary | ICD-10-CM

## 2012-11-09 DIAGNOSIS — C9000 Multiple myeloma not having achieved remission: Secondary | ICD-10-CM

## 2012-11-09 LAB — CBC WITH DIFFERENTIAL/PLATELET
Basophils Absolute: 0 10*3/uL (ref 0.0–0.1)
EOS%: 9.7 % — ABNORMAL HIGH (ref 0.0–7.0)
HGB: 14 g/dL (ref 13.0–17.1)
MCH: 32.5 pg (ref 27.2–33.4)
NEUT#: 1.2 10*3/uL — ABNORMAL LOW (ref 1.5–6.5)
RDW: 15.2 % — ABNORMAL HIGH (ref 11.0–14.6)
lymph#: 0.8 10*3/uL — ABNORMAL LOW (ref 0.9–3.3)

## 2012-11-09 LAB — COMPREHENSIVE METABOLIC PANEL (CC13)
ALT: 24 U/L (ref 0–55)
AST: 20 U/L (ref 5–34)
Alkaline Phosphatase: 65 U/L (ref 40–150)
Calcium: 8.8 mg/dL (ref 8.4–10.4)
Chloride: 107 mEq/L (ref 98–107)
Creatinine: 1.1 mg/dL (ref 0.7–1.3)
Total Bilirubin: 0.5 mg/dL (ref 0.20–1.20)

## 2012-11-09 MED ORDER — SODIUM CHLORIDE 0.9 % IV SOLN
Freq: Once | INTRAVENOUS | Status: AC
Start: 2012-11-09 — End: 2012-11-09
  Administered 2012-11-09: 11:00:00 via INTRAVENOUS

## 2012-11-09 MED ORDER — ZOLEDRONIC ACID 4 MG/100ML IV SOLN
4.0000 mg | Freq: Once | INTRAVENOUS | Status: AC
  Administered 2012-11-09: 4 mg via INTRAVENOUS
  Filled 2012-11-09: qty 100

## 2012-11-09 NOTE — Patient Instructions (Signed)
[facility] Discharge Instructions for Patients   Today you received the following : Zometa   BELOW ARE SYMPTOMS THAT SHOULD BE REPORTED IMMEDIATELY:  *FEVER GREATER THAN 100.5 F  *CHILLS WITH OR WITHOUT FEVER  NAUSEA AND VOMITING THAT IS NOT CONTROLLED WITH YOUR NAUSEA MEDICATION  *UNUSUAL SHORTNESS OF BREATH  *UNUSUAL BRUISING OR BLEEDING  TENDERNESS IN MOUTH AND THROAT WITH OR WITHOUT PRESENCE OF ULCERS  *URINARY PROBLEMS  *BOWEL PROBLEMS  UNUSUAL RASH Items with * indicate a potential emergency and should be followed up as soon as possible.  Feel free to call the clinic you have any questions or concerns. The clinic phone number is [phone].    

## 2012-11-10 LAB — KAPPA/LAMBDA LIGHT CHAINS
Kappa:Lambda Ratio: 0.62 (ref 0.26–1.65)
Lambda Free Lght Chn: 1.3 mg/dL (ref 0.57–2.63)

## 2012-11-16 ENCOUNTER — Ambulatory Visit (HOSPITAL_BASED_OUTPATIENT_CLINIC_OR_DEPARTMENT_OTHER): Payer: PRIVATE HEALTH INSURANCE | Admitting: Oncology

## 2012-11-16 VITALS — BP 121/79 | HR 72 | Temp 98.2°F | Resp 20 | Ht 67.0 in | Wt 153.9 lb

## 2012-11-16 DIAGNOSIS — C9000 Multiple myeloma not having achieved remission: Secondary | ICD-10-CM

## 2012-11-16 DIAGNOSIS — C9001 Multiple myeloma in remission: Secondary | ICD-10-CM

## 2012-11-16 NOTE — Progress Notes (Signed)
Hematology and Oncology Follow Up Visit  Ralph Barnett [id number] Sep 04, 1975 37 y.o. 11/16/2012 1:10 PM   Principle Diagnosis:No diagnosis found.   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 Please see previous notes for full details. He received initial induction treatment with RVD and then Cytoxan IV conditioning, stem cell harvest, then high dose IV melphalan with stem cell support on 10/01/2011. It is hard to believe that he is already out over one year from the transplant. He continues on Revlimid maintenance 10 mg daily. Most recent lab evaluation and continues to show a remission with kappa serum free light chains 0.81 mg percent, lambda 1.3, ratio 0.62 on 11/09/2012. He did not have proteinuria at diagnosis. He did not have an elevation of total serum immunoglobulins.  He has not had any interim major infections although he keeps getting recurrent bronchitis. He denies any bone pain. He denies any paresthesias. No change in his normal bowel habit on the Revlimid. No skin rash.  Medications: reviewed  Allergies:  Allergies  Allergen Reactions  . Shellfish Allergy Itching    Itching throat Unknown, tickling in throat  . Silver Itching    tegaderm dsg    Review of Systems: Constitutional:   No constitutional symptoms Respiratory: Current bronchitis Cardiovascular:  No chest pain or palpitations Gastrointestinal: See above Genito-Urinary: Not questioned Musculoskeletal: See above Neurologic: No headache or change in vision, no paresthesias Skin: No rash or ecchymosis Remaining ROS negative.  Physical Exam: Blood pressure 121/79, pulse 72, temperature 98.2 F (36.8 C), temperature source Oral, resp. rate 20, height 5\' 7"  (1.702 m), weight 153 lb 14.4 oz (69.809 kg). Wt Readings  from Last 3 Encounters:  11/16/12 153 lb 14.4 oz (69.809 kg)  10/04/12 160 lb 11.2 oz (72.893 kg)  07/23/12 164 lb (74.39 kg)     General appearance: Now healthy appearing Caucasian man HENNT: Pharynx no erythema exudate or ulcer Lymph nodes: No adenopathy Breasts: Lungs: Clear to auscultation resonant to percussion Heart: Regular rhythm no murmur Abdomen: Soft, nontender, no mass, no organomegaly Extremities: No edema, no calf tenderness Vascular: No cyanosis Neurologic: Motor strength 5 over 5, reflexes 1+ symmetric, sensation intact to vibration over the fingertips Skin: No rash or ecchymosis  Lab Results: Lab Results  Component Value Date   WBC 2.8* 11/09/2012   HGB 14.0 11/09/2012   HCT 40.4 11/09/2012   MCV 94.3 11/09/2012   PLT 111* 11/09/2012     Chemistry      Component Value Date/Time   NA 141 11/09/2012 1100   NA 142 03/29/2012 0908   K 3.8 11/09/2012 1100   K 3.5 03/29/2012 0908   CL 107 11/09/2012 1100   CL 107 03/29/2012 0908   CO2 25 11/09/2012 1100   CO2 25 03/29/2012 0908   BUN 7.5 11/09/2012 1100   BUN 8 03/29/2012 0908   CREATININE 1.1 11/09/2012 1100   CREATININE 1.02 03/29/2012 0908   CREATININE 1.09 08/26/2011 1019      Component Value Date/Time   CALCIUM 8.8 11/09/2012 1100   CALCIUM 8.6 03/29/2012 0908   CALCIUM 10.6* 05/15/2011 1547   ALKPHOS 65 11/09/2012 1100   ALKPHOS 60 03/29/2012 0908   AST 20 11/09/2012 1100   AST 27 03/29/2012 0908   ALT 24 11/09/2012 1100   ALT 34 03/29/2012 0908  BILITOT 0.50 11/09/2012 1100   BILITOT 0.4 03/29/2012 0908       Radiological Studies: No results found.  Impression and Plan: #1.Kappa light chain myeloma He remains in hematologic remission 14 months post high dose IV melphalan with autologous stem cell support. Plan: Continue maintenance Revlimid. Continue monthly Zometa to complete 2 years and then decrease to every 4-6 months. We again reviewed the need for surveillance for second primary malignancy 2 to the immunosuppression by  long-term Revlimid.  #2. A post transplant CMV pneumonia treated with ganciclovir with complete resolution  #3. Recurrent bronchitis. Suspect he has decreased immunoglobulins from the myeloma and the transplant. We may need to consider maintenance intravenous immunoglobulin.  #4. 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   CC:. Dr. Birdie Sons; Dr. Marlaine Hind   [name], MD 4/8/20141:10 PM

## 2012-11-17 ENCOUNTER — Telehealth: Payer: Self-pay | Admitting: Oncology

## 2012-11-17 NOTE — Telephone Encounter (Signed)
Called pt and left message for appt for lab April and MAy 2014

## 2012-12-03 ENCOUNTER — Encounter: Payer: Self-pay | Admitting: *Deleted

## 2012-12-07 ENCOUNTER — Ambulatory Visit (HOSPITAL_BASED_OUTPATIENT_CLINIC_OR_DEPARTMENT_OTHER): Payer: PRIVATE HEALTH INSURANCE

## 2012-12-07 ENCOUNTER — Other Ambulatory Visit (HOSPITAL_BASED_OUTPATIENT_CLINIC_OR_DEPARTMENT_OTHER): Payer: PRIVATE HEALTH INSURANCE | Admitting: Lab

## 2012-12-07 ENCOUNTER — Other Ambulatory Visit: Payer: Self-pay | Admitting: Oncology

## 2012-12-07 VITALS — BP 113/73 | HR 67 | Temp 97.9°F

## 2012-12-07 DIAGNOSIS — M899 Disorder of bone, unspecified: Secondary | ICD-10-CM

## 2012-12-07 DIAGNOSIS — C9000 Multiple myeloma not having achieved remission: Secondary | ICD-10-CM

## 2012-12-07 DIAGNOSIS — M949 Disorder of cartilage, unspecified: Secondary | ICD-10-CM

## 2012-12-07 DIAGNOSIS — M898X9 Other specified disorders of bone, unspecified site: Secondary | ICD-10-CM

## 2012-12-07 LAB — COMPREHENSIVE METABOLIC PANEL
CO2: 26 mEq/L (ref 19–32)
Calcium: 9.4 mg/dL (ref 8.4–10.5)
Chloride: 105 mEq/L (ref 96–112)
Creatinine, Ser: 0.89 mg/dL (ref 0.50–1.35)
Glucose, Bld: 99 mg/dL (ref 70–99)
Total Bilirubin: 0.3 mg/dL (ref 0.3–1.2)

## 2012-12-07 LAB — CBC WITH DIFFERENTIAL/PLATELET
BASO%: 4.5 % — ABNORMAL HIGH (ref 0.0–2.0)
EOS%: 11.3 % — ABNORMAL HIGH (ref 0.0–7.0)
HCT: 41 % (ref 38.4–49.9)
LYMPH%: 34.2 % (ref 14.0–49.0)
MCH: 32 pg (ref 27.2–33.4)
MCHC: 34.1 g/dL (ref 32.0–36.0)
MCV: 93.9 fL (ref 79.3–98.0)
MONO%: 17 % — ABNORMAL HIGH (ref 0.0–14.0)
NEUT%: 33 % — ABNORMAL LOW (ref 39.0–75.0)
Platelets: 124 10*3/uL — ABNORMAL LOW (ref 140–400)

## 2012-12-07 MED ORDER — ZOLEDRONIC ACID 4 MG/100ML IV SOLN
4.0000 mg | Freq: Once | INTRAVENOUS | Status: AC
  Administered 2012-12-07: 4 mg via INTRAVENOUS
  Filled 2012-12-07: qty 100

## 2012-12-08 ENCOUNTER — Other Ambulatory Visit: Payer: PRIVATE HEALTH INSURANCE | Admitting: Lab

## 2012-12-08 ENCOUNTER — Other Ambulatory Visit: Payer: Self-pay | Admitting: Oncology

## 2012-12-08 ENCOUNTER — Other Ambulatory Visit: Payer: Self-pay

## 2012-12-13 ENCOUNTER — Telehealth: Payer: Self-pay | Admitting: *Deleted

## 2012-12-13 NOTE — Telephone Encounter (Signed)
Lm gv appt for 5/28.the patient is aware...td

## 2012-12-14 ENCOUNTER — Telehealth: Payer: Self-pay | Admitting: *Deleted

## 2012-12-14 NOTE — Telephone Encounter (Signed)
sw pt informed him that we were able to get him in early as requested for 01/05/13. gv new appt time for 12:30pm lab w/ 1pm tx.pt is aware...td

## 2012-12-16 ENCOUNTER — Other Ambulatory Visit: Payer: PRIVATE HEALTH INSURANCE | Admitting: Lab

## 2012-12-16 ENCOUNTER — Ambulatory Visit: Payer: PRIVATE HEALTH INSURANCE

## 2013-01-05 ENCOUNTER — Other Ambulatory Visit (HOSPITAL_BASED_OUTPATIENT_CLINIC_OR_DEPARTMENT_OTHER): Payer: PRIVATE HEALTH INSURANCE | Admitting: Lab

## 2013-01-05 ENCOUNTER — Ambulatory Visit (HOSPITAL_BASED_OUTPATIENT_CLINIC_OR_DEPARTMENT_OTHER): Payer: PRIVATE HEALTH INSURANCE

## 2013-01-05 VITALS — BP 100/64 | HR 71 | Temp 98.5°F | Resp 20

## 2013-01-05 DIAGNOSIS — M898X9 Other specified disorders of bone, unspecified site: Secondary | ICD-10-CM

## 2013-01-05 DIAGNOSIS — C9000 Multiple myeloma not having achieved remission: Secondary | ICD-10-CM

## 2013-01-05 DIAGNOSIS — M899 Disorder of bone, unspecified: Secondary | ICD-10-CM

## 2013-01-05 LAB — COMPREHENSIVE METABOLIC PANEL (CC13)
ALT: 19 U/L (ref 0–55)
AST: 18 U/L (ref 5–34)
Albumin: 4 g/dL (ref 3.5–5.0)
Alkaline Phosphatase: 61 U/L (ref 40–150)
Glucose: 101 mg/dl — ABNORMAL HIGH (ref 70–99)
Potassium: 3.7 mEq/L (ref 3.5–5.1)
Sodium: 141 mEq/L (ref 136–145)
Total Bilirubin: 0.7 mg/dL (ref 0.20–1.20)
Total Protein: 6.7 g/dL (ref 6.4–8.3)

## 2013-01-05 LAB — CBC WITH DIFFERENTIAL/PLATELET
BASO%: 3.3 % — ABNORMAL HIGH (ref 0.0–2.0)
Eosinophils Absolute: 0.2 10*3/uL (ref 0.0–0.5)
HCT: 41 % (ref 38.4–49.9)
LYMPH%: 36.8 % (ref 14.0–49.0)
MONO#: 0.4 10*3/uL (ref 0.1–0.9)
NEUT#: 1 10*3/uL — ABNORMAL LOW (ref 1.5–6.5)
Platelets: 114 10*3/uL — ABNORMAL LOW (ref 140–400)
RBC: 4.37 10*6/uL (ref 4.20–5.82)
WBC: 2.7 10*3/uL — ABNORMAL LOW (ref 4.0–10.3)
lymph#: 1 10*3/uL (ref 0.9–3.3)

## 2013-01-05 MED ORDER — HEPARIN SOD (PORK) LOCK FLUSH 100 UNIT/ML IV SOLN
250.0000 [IU] | Freq: Once | INTRAVENOUS | Status: DC | PRN
  Filled 2013-01-05: qty 5

## 2013-01-05 MED ORDER — HEPARIN SOD (PORK) LOCK FLUSH 100 UNIT/ML IV SOLN
500.0000 [IU] | Freq: Once | INTRAVENOUS | Status: DC | PRN
  Filled 2013-01-05: qty 5

## 2013-01-05 MED ORDER — SODIUM CHLORIDE 0.9 % IJ SOLN
3.0000 mL | Freq: Once | INTRAMUSCULAR | Status: DC | PRN
  Filled 2013-01-05: qty 10

## 2013-01-05 MED ORDER — ALTEPLASE 2 MG IJ SOLR
2.0000 mg | Freq: Once | INTRAMUSCULAR | Status: DC | PRN
  Filled 2013-01-05: qty 2

## 2013-01-05 MED ORDER — SODIUM CHLORIDE 0.9 % IJ SOLN
10.0000 mL | INTRAMUSCULAR | Status: DC | PRN
  Filled 2013-01-05: qty 10

## 2013-01-05 MED ORDER — SODIUM CHLORIDE 0.9 % IV SOLN
INTRAVENOUS | Status: DC
  Administered 2013-01-05: 13:00:00 via INTRAVENOUS

## 2013-01-05 MED ORDER — ZOLEDRONIC ACID 4 MG/100ML IV SOLN
4.0000 mg | Freq: Once | INTRAVENOUS | Status: AC
Start: 2013-01-05 — End: 2013-01-05
  Administered 2013-01-05: 4 mg via INTRAVENOUS
  Filled 2013-01-05: qty 100

## 2013-01-05 NOTE — Patient Instructions (Signed)
Patient aware of next appointment; discharged home with no complaints. 

## 2013-01-06 LAB — KAPPA/LAMBDA LIGHT CHAINS
Kappa free light chain: 1.22 mg/dL (ref 0.33–1.94)
Kappa:Lambda Ratio: 0.8 (ref 0.26–1.65)

## 2013-01-17 ENCOUNTER — Ambulatory Visit: Payer: PRIVATE HEALTH INSURANCE

## 2013-01-17 ENCOUNTER — Other Ambulatory Visit: Payer: PRIVATE HEALTH INSURANCE | Admitting: Lab

## 2013-01-20 ENCOUNTER — Other Ambulatory Visit: Payer: PRIVATE HEALTH INSURANCE | Admitting: Lab

## 2013-01-27 ENCOUNTER — Telehealth: Payer: Self-pay | Admitting: *Deleted

## 2013-01-27 ENCOUNTER — Telehealth: Payer: Self-pay | Admitting: Oncology

## 2013-01-27 NOTE — Telephone Encounter (Signed)
Per scheduler I have moved appt from 6/25 to 6/24

## 2013-01-27 NOTE — Telephone Encounter (Signed)
Pt called , Zometa r/s to 6/24 per pt rqst

## 2013-01-31 ENCOUNTER — Telehealth: Payer: Self-pay | Admitting: Oncology

## 2013-01-31 ENCOUNTER — Telehealth: Payer: Self-pay | Admitting: *Deleted

## 2013-01-31 NOTE — Telephone Encounter (Signed)
Gave pt appt for lab and Md for june and July 2014

## 2013-01-31 NOTE — Telephone Encounter (Signed)
Per staff message I have adjusted appts. JM W 

## 2013-02-01 ENCOUNTER — Ambulatory Visit (HOSPITAL_BASED_OUTPATIENT_CLINIC_OR_DEPARTMENT_OTHER): Payer: PRIVATE HEALTH INSURANCE

## 2013-02-01 ENCOUNTER — Other Ambulatory Visit (HOSPITAL_BASED_OUTPATIENT_CLINIC_OR_DEPARTMENT_OTHER): Payer: PRIVATE HEALTH INSURANCE | Admitting: Lab

## 2013-02-01 VITALS — BP 113/78 | HR 63 | Temp 98.3°F

## 2013-02-01 DIAGNOSIS — C9000 Multiple myeloma not having achieved remission: Secondary | ICD-10-CM

## 2013-02-01 DIAGNOSIS — M899 Disorder of bone, unspecified: Secondary | ICD-10-CM

## 2013-02-01 LAB — CBC WITH DIFFERENTIAL/PLATELET
BASO%: 3 % — ABNORMAL HIGH (ref 0.0–2.0)
EOS%: 6.5 % (ref 0.0–7.0)
Eosinophils Absolute: 0.2 10*3/uL (ref 0.0–0.5)
LYMPH%: 33.4 % (ref 14.0–49.0)
MCH: 32.5 pg (ref 27.2–33.4)
MCHC: 35 g/dL (ref 32.0–36.0)
MCV: 92.6 fL (ref 79.3–98.0)
MONO%: 16.1 % — ABNORMAL HIGH (ref 0.0–14.0)
NEUT#: 1 10*3/uL — ABNORMAL LOW (ref 1.5–6.5)
Platelets: 108 10*3/uL — ABNORMAL LOW (ref 140–400)
RBC: 4.39 10*6/uL (ref 4.20–5.82)
RDW: 14.5 % (ref 11.0–14.6)

## 2013-02-01 LAB — COMPREHENSIVE METABOLIC PANEL (CC13)
ALT: 14 U/L (ref 0–55)
AST: 15 U/L (ref 5–34)
Calcium: 9.3 mg/dL (ref 8.4–10.4)
Chloride: 106 mEq/L (ref 98–107)
Creatinine: 1.2 mg/dL (ref 0.7–1.3)
Potassium: 3.4 mEq/L — ABNORMAL LOW (ref 3.5–5.1)

## 2013-02-01 MED ORDER — ZOLEDRONIC ACID 4 MG/100ML IV SOLN
4.0000 mg | Freq: Once | INTRAVENOUS | Status: AC
  Administered 2013-02-01: 4 mg via INTRAVENOUS
  Filled 2013-02-01: qty 100

## 2013-02-02 ENCOUNTER — Ambulatory Visit: Payer: PRIVATE HEALTH INSURANCE

## 2013-02-02 ENCOUNTER — Other Ambulatory Visit: Payer: PRIVATE HEALTH INSURANCE | Admitting: Lab

## 2013-02-15 ENCOUNTER — Other Ambulatory Visit: Payer: PRIVATE HEALTH INSURANCE | Admitting: Lab

## 2013-02-15 ENCOUNTER — Ambulatory Visit: Payer: PRIVATE HEALTH INSURANCE

## 2013-02-17 ENCOUNTER — Other Ambulatory Visit: Payer: PRIVATE HEALTH INSURANCE | Admitting: Lab

## 2013-03-02 ENCOUNTER — Ambulatory Visit (HOSPITAL_BASED_OUTPATIENT_CLINIC_OR_DEPARTMENT_OTHER): Payer: PRIVATE HEALTH INSURANCE

## 2013-03-02 ENCOUNTER — Other Ambulatory Visit (HOSPITAL_BASED_OUTPATIENT_CLINIC_OR_DEPARTMENT_OTHER): Payer: PRIVATE HEALTH INSURANCE | Admitting: Lab

## 2013-03-02 DIAGNOSIS — M899 Disorder of bone, unspecified: Secondary | ICD-10-CM

## 2013-03-02 DIAGNOSIS — C9000 Multiple myeloma not having achieved remission: Secondary | ICD-10-CM

## 2013-03-02 DIAGNOSIS — M898X9 Other specified disorders of bone, unspecified site: Secondary | ICD-10-CM

## 2013-03-02 LAB — CBC WITH DIFFERENTIAL/PLATELET
BASO%: 2.4 % — ABNORMAL HIGH (ref 0.0–2.0)
Basophils Absolute: 0.1 10*3/uL (ref 0.0–0.1)
EOS%: 6.6 % (ref 0.0–7.0)
Eosinophils Absolute: 0.1 10*3/uL (ref 0.0–0.5)
HCT: 41.8 % (ref 38.4–49.9)
HGB: 14.6 g/dL (ref 13.0–17.1)
LYMPH%: 32.4 % (ref 14.0–49.0)
MCH: 32.8 pg (ref 27.2–33.4)
MCHC: 34.9 g/dL (ref 32.0–36.0)
MCV: 94.2 fL (ref 79.3–98.0)
MONO#: 0.4 10*3/uL (ref 0.1–0.9)
MONO%: 18.1 % — ABNORMAL HIGH (ref 0.0–14.0)
NEUT#: 0.9 10*3/uL — ABNORMAL LOW (ref 1.5–6.5)
NEUT%: 40.5 % (ref 39.0–75.0)
Platelets: 114 10*3/uL — ABNORMAL LOW (ref 140–400)
RBC: 4.44 10*6/uL (ref 4.20–5.82)
RDW: 14.2 % (ref 11.0–14.6)
WBC: 2.2 10*3/uL — ABNORMAL LOW (ref 4.0–10.3)
lymph#: 0.7 10*3/uL — ABNORMAL LOW (ref 0.9–3.3)

## 2013-03-02 LAB — COMPREHENSIVE METABOLIC PANEL (CC13)
BUN: 8.7 mg/dL (ref 7.0–26.0)
CO2: 27 mEq/L (ref 22–29)
Calcium: 9.2 mg/dL (ref 8.4–10.4)
Creatinine: 1.1 mg/dL (ref 0.7–1.3)
Glucose: 98 mg/dl (ref 70–140)
Total Bilirubin: 0.54 mg/dL (ref 0.20–1.20)

## 2013-03-02 MED ORDER — SODIUM CHLORIDE 0.9 % IJ SOLN
3.0000 mL | Freq: Once | INTRAMUSCULAR | Status: DC | PRN
  Filled 2013-03-02: qty 10

## 2013-03-02 MED ORDER — HEPARIN SOD (PORK) LOCK FLUSH 100 UNIT/ML IV SOLN
500.0000 [IU] | Freq: Once | INTRAVENOUS | Status: DC | PRN
  Filled 2013-03-02: qty 5

## 2013-03-02 MED ORDER — HEPARIN SOD (PORK) LOCK FLUSH 100 UNIT/ML IV SOLN
250.0000 [IU] | Freq: Once | INTRAVENOUS | Status: DC | PRN
  Filled 2013-03-02: qty 5

## 2013-03-02 MED ORDER — SODIUM CHLORIDE 0.9 % IJ SOLN
10.0000 mL | INTRAMUSCULAR | Status: DC | PRN
  Filled 2013-03-02: qty 10

## 2013-03-02 MED ORDER — ALTEPLASE 2 MG IJ SOLR
2.0000 mg | Freq: Once | INTRAMUSCULAR | Status: DC | PRN
  Filled 2013-03-02: qty 2

## 2013-03-02 MED ORDER — ZOLEDRONIC ACID 4 MG/100ML IV SOLN
4.0000 mg | Freq: Once | INTRAVENOUS | Status: AC
  Administered 2013-03-02: 4 mg via INTRAVENOUS
  Filled 2013-03-02: qty 100

## 2013-03-02 NOTE — Patient Instructions (Addendum)
Zoledronic Acid injection (Hypercalcemia, Oncology) What is this medicine? ZOLEDRONIC ACID (ZOE le dron ik AS id) lowers the amount of calcium loss from bone. It is used to treat too much calcium in your blood from cancer. It is also used to prevent complications of cancer that has spread to the bone. This medicine may be used for other purposes; ask your health care provider or pharmacist if you have questions. What should I tell my health care provider before I take this medicine? They need to know if you have any of these conditions: -aspirin-sensitive asthma -dental disease -kidney disease -an unusual or allergic reaction to zoledronic acid, other medicines, foods, dyes, or preservatives -pregnant or trying to get pregnant -breast-feeding How should I use this medicine? This medicine is for infusion into a vein. It is given by a health care professional in a hospital or clinic setting. Talk to your pediatrician regarding the use of this medicine in children. Special care may be needed. Overdosage: If you think you have taken too much of this medicine contact a poison control center or emergency room at once. NOTE: This medicine is only for you. Do not share this medicine with others. What if I miss a dose? It is important not to miss your dose. Call your doctor or health care professional if you are unable to keep an appointment. What may interact with this medicine? -certain antibiotics given by injection -NSAIDs, medicines for pain and inflammation, like ibuprofen or naproxen -some diuretics like bumetanide, furosemide -teriparatide -thalidomide This list may not describe all possible interactions. Give your health care provider a list of all the medicines, herbs, non-prescription drugs, or dietary supplements you use. Also tell them if you smoke, drink alcohol, or use illegal drugs. Some items may interact with your medicine. What should I watch for while using this medicine? Visit  your doctor or health care professional for regular checkups. It may be some time before you see the benefit from this medicine. Do not stop taking your medicine unless your doctor tells you to. Your doctor may order blood tests or other tests to see how you are doing. Women should inform their doctor if they wish to become pregnant or think they might be pregnant. There is a potential for serious side effects to an unborn child. Talk to your health care professional or pharmacist for more information. You should make sure that you get enough calcium and vitamin D while you are taking this medicine. Discuss the foods you eat and the vitamins you take with your health care professional. Some people who take this medicine have severe bone, joint, and/or muscle pain. This medicine may also increase your risk for a broken thigh bone. Tell your doctor right away if you have pain in your upper leg or groin. Tell your doctor if you have any pain that does not go away or that gets worse. What side effects may I notice from receiving this medicine? Side effects that you should report to your doctor or health care professional as soon as possible: -allergic reactions like skin rash, itching or hives, swelling of the face, lips, or tongue -anxiety, confusion, or depression -breathing problems -changes in vision -feeling faint or lightheaded, falls -jaw burning, cramping, pain -muscle cramps, stiffness, or weakness -trouble passing urine or change in the amount of urine Side effects that usually do not require medical attention (report to your doctor or health care professional if they continue or are bothersome): -bone, joint, or muscle pain -  fever -hair loss -irritation at site where injected -loss of appetite -nausea, vomiting -stomach upset -tired This list may not describe all possible side effects. Call your doctor for medical advice about side effects. You may report side effects to FDA at  1-800-FDA-1088. Where should I keep my medicine? This drug is given in a hospital or clinic and will not be stored at home. NOTE: This sheet is a summary. It may not cover all possible information. If you have questions about this medicine, talk to your doctor, pharmacist, or health care provider.  2013, Elsevier/Gold Standard. (01/24/2011 9:06:58 AM)  

## 2013-03-09 ENCOUNTER — Other Ambulatory Visit: Payer: Self-pay | Admitting: *Deleted

## 2013-03-09 DIAGNOSIS — C9000 Multiple myeloma not having achieved remission: Secondary | ICD-10-CM

## 2013-03-10 ENCOUNTER — Telehealth: Payer: Self-pay | Admitting: Oncology

## 2013-03-10 NOTE — Telephone Encounter (Signed)
Added lb appts for 9/17, 10/15 and 11/12 per 7/30 pof. S/w pt he is aware and will get schedule when he comes in 8/11.

## 2013-03-21 ENCOUNTER — Other Ambulatory Visit (HOSPITAL_BASED_OUTPATIENT_CLINIC_OR_DEPARTMENT_OTHER): Payer: PRIVATE HEALTH INSURANCE | Admitting: Lab

## 2013-03-21 ENCOUNTER — Telehealth: Payer: Self-pay | Admitting: *Deleted

## 2013-03-21 ENCOUNTER — Ambulatory Visit (HOSPITAL_BASED_OUTPATIENT_CLINIC_OR_DEPARTMENT_OTHER): Payer: PRIVATE HEALTH INSURANCE | Admitting: Oncology

## 2013-03-21 ENCOUNTER — Ambulatory Visit: Payer: PRIVATE HEALTH INSURANCE

## 2013-03-21 VITALS — BP 117/59 | HR 74 | Temp 98.6°F | Resp 20 | Ht 67.0 in | Wt 156.6 lb

## 2013-03-21 DIAGNOSIS — D708 Other neutropenia: Secondary | ICD-10-CM

## 2013-03-21 DIAGNOSIS — C9000 Multiple myeloma not having achieved remission: Secondary | ICD-10-CM

## 2013-03-21 DIAGNOSIS — D61818 Other pancytopenia: Secondary | ICD-10-CM

## 2013-03-21 LAB — CBC WITH DIFFERENTIAL/PLATELET
Basophils Absolute: 0.1 10*3/uL (ref 0.0–0.1)
EOS%: 4.1 % (ref 0.0–7.0)
MCH: 32.5 pg (ref 27.2–33.4)
MCV: 91.6 fL (ref 79.3–98.0)
MONO%: 25.6 % — ABNORMAL HIGH (ref 0.0–14.0)
RBC: 4.52 10*6/uL (ref 4.20–5.82)
RDW: 12.9 % (ref 11.0–14.6)
nRBC: 0 % (ref 0–0)

## 2013-03-21 NOTE — Telephone Encounter (Signed)
Per staff phone call and POF I have schedueld appts.  JMW  

## 2013-03-21 NOTE — Progress Notes (Signed)
Hematology and Oncology Follow Up Visit  Ralph Barnett [id number] September 28, 1975 37 y.o. 03/21/2013 6:04 PM   Principle Diagnosis: Encounter Diagnosis  Name Primary?  . 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  He received initial induction treatment with RVD and then Cytoxan IV conditioning, stem cell harvest, then high dose IV melphalan with stem cell support on 10/01/2011. 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  He continues on Revlimid maintenance 10 mg daily. He has had prolonged neutropenia which has persisted despite dose adjustments of his Revlimid. He has had mild thrombocytopenia but consistently over 100,000 until today's value of 89,000. No anemia. Serum free light chains remain normal with a normal kappa to lambda ratio with most recent value done here on July 23 kappa free light chains 1.25 mg percent, lambda 1.72, ratio 0.73.   He has had no interim infections. He denies any active bone pain.  Medications: reviewed  Allergies:  Allergies  Allergen Reactions  . Shellfish Allergy Itching    Itching throat Unknown, tickling in throat  . Silver Itching    tegaderm dsg    Review of Systems: Constitutional:  No constitutional symptoms  Respiratory:No cough or dyspnea  Cardiovascular:  No chest pain or palpitations  Gastrointestinal:No change in bowel habit  Genito-Urinary: Not questioned  Musculoskeletal:See above  Neurologic:Not questioned  Skin:No rash  Remaining ROS negative.  Physical Exam: Blood pressure 117/59, pulse 74, temperature 98.6 F (37 C), temperature source Oral, resp. rate 20, height 5\' 7"  (1.702 m), weight 156 lb 9.6 oz (71.033 kg). Wt Readings from Last 3 Encounters:  03/21/13 156 lb 9.6 oz (71.033 kg)  11/16/12  153 lb 14.4 oz (69.809 kg)  10/04/12 160 lb 11.2 oz (72.893 kg)     General appearance: Well-nourished Caucasian man  HENNT: Pharynx no erythema or exudate  Lymph nodes: No adenopathy  Breasts: Lungs:Clear to auscultation resonant to percussion  Heart:Regular rhythm no murmur  Abdomen:Soft, nontender, no mass, no organomegaly  Extremities:No edema, no calf tenderness  Musculoskeletal:No joint deformities  GU: Vascular:No cyanosis  Neurologic:Motor strength 5 over 5, reflexes 1+ symmetric, sensation intact to vibration over the fingertips  Skin:No rash or ecchymosis  Lab Results: Lab Results  Component Value Date   WBC 1.7* 03/21/2013   HGB 14.7 03/21/2013   HCT 41.4 03/21/2013   MCV 91.6 03/21/2013   PLT 89* 03/21/2013     Chemistry      Component Value Date/Time   NA 141 03/02/2013 1207   NA 142 12/07/2012 1649   K 3.7 03/02/2013 1207   K 3.7 12/07/2012 1649   CL 106 02/01/2013 1514   CL 105 12/07/2012 1649   CO2 27 03/02/2013 1207   CO2 26 12/07/2012 1649   BUN 8.7 03/02/2013 1207   BUN 10 12/07/2012 1649   CREATININE 1.1 03/02/2013 1207   CREATININE 0.89 12/07/2012 1649   CREATININE 1.09 08/26/2011 1019      Component Value Date/Time   CALCIUM 9.2 03/02/2013 1207   CALCIUM 9.4 12/07/2012 1649   CALCIUM 10.6* 05/15/2011 1547   ALKPHOS 61 03/02/2013 1207   ALKPHOS 65 12/07/2012 1649   AST 23 03/02/2013 1207   AST 23 12/07/2012 1649   ALT 33 03/02/2013 1207   ALT 20 12/07/2012 1649   BILITOT 0.54  03/02/2013 1207   BILITOT 0.3 12/07/2012 1649     Impression: #1.Kappa light chain myeloma  He remains in hematologic remission 18 months post high dose IV melphalan with autologous stem cell support.  Plan:  Continue maintenance Revlimid. Continue monthly Zometa to complete 2 years and then decrease to every 4-6 months.    #2. Post transplant CMV pneumonia treated with ganciclovir with complete resolution   #3. Recurrent episodes of bronchitis.    #4. 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 I told him to discuss this with Dr. Greggory Barnett, his transplant physician. I think we need to stop the Revlimid again and allow his white count to drift up and then resume at a lower dose.          Ralph Feinstein, MD 8/11/20146:04 PM

## 2013-03-22 ENCOUNTER — Other Ambulatory Visit: Payer: Self-pay | Admitting: Oncology

## 2013-03-22 DIAGNOSIS — C9000 Multiple myeloma not having achieved remission: Secondary | ICD-10-CM

## 2013-03-22 DIAGNOSIS — D702 Other drug-induced agranulocytosis: Secondary | ICD-10-CM

## 2013-03-24 ENCOUNTER — Other Ambulatory Visit: Payer: PRIVATE HEALTH INSURANCE | Admitting: Lab

## 2013-03-30 ENCOUNTER — Other Ambulatory Visit (HOSPITAL_BASED_OUTPATIENT_CLINIC_OR_DEPARTMENT_OTHER): Payer: PRIVATE HEALTH INSURANCE | Admitting: Lab

## 2013-03-30 ENCOUNTER — Other Ambulatory Visit: Payer: PRIVATE HEALTH INSURANCE | Admitting: Lab

## 2013-03-30 ENCOUNTER — Ambulatory Visit (HOSPITAL_BASED_OUTPATIENT_CLINIC_OR_DEPARTMENT_OTHER): Payer: PRIVATE HEALTH INSURANCE

## 2013-03-30 VITALS — BP 119/81 | HR 66 | Temp 97.6°F | Resp 18

## 2013-03-30 DIAGNOSIS — C9000 Multiple myeloma not having achieved remission: Secondary | ICD-10-CM

## 2013-03-30 DIAGNOSIS — D702 Other drug-induced agranulocytosis: Secondary | ICD-10-CM

## 2013-03-30 DIAGNOSIS — M899 Disorder of bone, unspecified: Secondary | ICD-10-CM

## 2013-03-30 LAB — COMPREHENSIVE METABOLIC PANEL (CC13)
ALT: 18 U/L (ref 0–55)
AST: 19 U/L (ref 5–34)
Albumin: 4 g/dL (ref 3.5–5.0)
BUN: 10.3 mg/dL (ref 7.0–26.0)
CO2: 25 mEq/L (ref 22–29)
Calcium: 9 mg/dL (ref 8.4–10.4)
Chloride: 106 mEq/L (ref 98–109)
Creatinine: 1.2 mg/dL (ref 0.7–1.3)
Potassium: 3.7 mEq/L (ref 3.5–5.1)

## 2013-03-30 LAB — CBC WITH DIFFERENTIAL/PLATELET
Eosinophils Absolute: 0.1 10*3/uL (ref 0.0–0.5)
LYMPH%: 38.8 % (ref 14.0–49.0)
MCV: 93.8 fL (ref 79.3–98.0)
MONO%: 17.7 % — ABNORMAL HIGH (ref 0.0–14.0)
NEUT#: 1 10*3/uL — ABNORMAL LOW (ref 1.5–6.5)
NEUT%: 35.7 % — ABNORMAL LOW (ref 39.0–75.0)
Platelets: 131 10*3/uL — ABNORMAL LOW (ref 140–400)
RBC: 4.49 10*6/uL (ref 4.20–5.82)

## 2013-03-30 MED ORDER — ZOLEDRONIC ACID 4 MG/100ML IV SOLN
4.0000 mg | Freq: Once | INTRAVENOUS | Status: AC
  Administered 2013-03-30: 4 mg via INTRAVENOUS
  Filled 2013-03-30: qty 100

## 2013-03-30 MED ORDER — SODIUM CHLORIDE 0.9 % IV SOLN
INTRAVENOUS | Status: DC
  Administered 2013-03-30: 09:00:00 via INTRAVENOUS

## 2013-03-30 MED ORDER — ZOLEDRONIC ACID 4 MG/5ML IV CONC: 4.0000 mg | Freq: Once | INTRAVENOUS | Status: DC

## 2013-03-30 NOTE — Patient Instructions (Addendum)
[facility] Discharge Instructions for Patients Receiving Chemotherapy  Today you received the following chemotherapy agents: Zometa  To help prevent nausea and vomiting after your treatment, we encourage you to take your nausea medication.   If you develop nausea and vomiting that is not controlled by your nausea medication, call the clinic.   BELOW ARE SYMPTOMS THAT SHOULD BE REPORTED IMMEDIATELY:  *FEVER GREATER THAN 100.5 F  *CHILLS WITH OR WITHOUT FEVER  NAUSEA AND VOMITING THAT IS NOT CONTROLLED WITH YOUR NAUSEA MEDICATION  *UNUSUAL SHORTNESS OF BREATH  *UNUSUAL BRUISING OR BLEEDING  TENDERNESS IN MOUTH AND THROAT WITH OR WITHOUT PRESENCE OF ULCERS  *URINARY PROBLEMS  *BOWEL PROBLEMS  UNUSUAL RASH Items with * indicate a potential emergency and should be followed up as soon as possible.  Feel free to call the clinic you have any questions or concerns. The clinic phone number is [phone].    

## 2013-03-31 LAB — KAPPA/LAMBDA LIGHT CHAINS: Kappa free light chain: 1.14 mg/dL (ref 0.33–1.94)

## 2013-04-01 ENCOUNTER — Telehealth: Payer: Self-pay | Admitting: Oncology

## 2013-04-01 NOTE — Telephone Encounter (Signed)
Cancelled labs per pt rqst

## 2013-04-03 IMAGING — CT CT ANGIO CHEST
1 of 6 series · 5 of 36 positions shown · IV contrast (Omnipaque 300)
Comparison: None
Correlation:  CT abdomen and pelvis 01/22/2010

CLINICAL DATA: Chest pain, pleuritic chest pain, elevated D-dimer

CT ANGIOGRAPHY CHEST WITH CONTRAST
TECHNIQUE: Multidetector CT imaging of the chest was performed
using the standard protocol during bolus administration of
intravenous contrast.  Multiplanar CT image reconstructions
including MIPs were obtained to evaluate the vascular anatomy.
Contrast:  100 ml Omnipaque 350 IV.

[Series 6: pe 3.0 b40f · axial · 0.68mm/px · z∈[-254,-98]mm · 5 of 80 slices shown]
[im 14/80  lung]
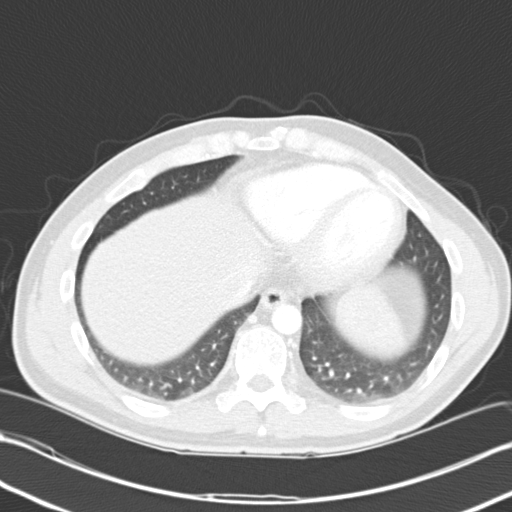
[im 27/80  mediastinal]
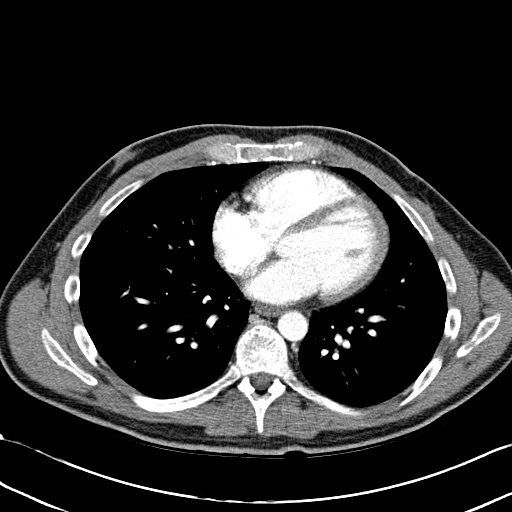
[im 40/80  lung]
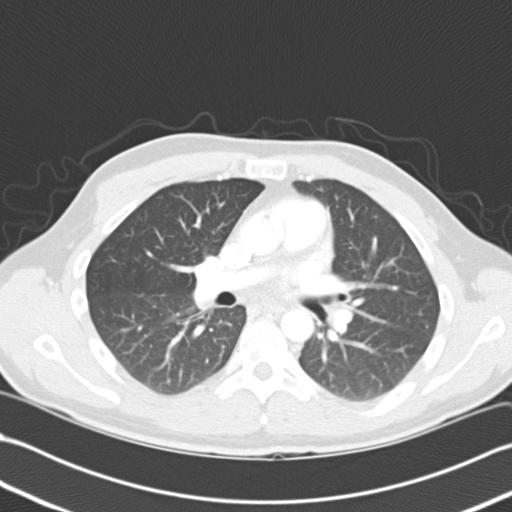
[im 53/80  mediastinal]
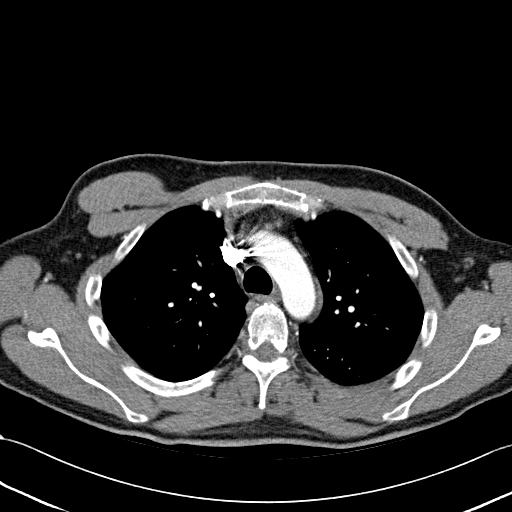
[im 66/80  lung]
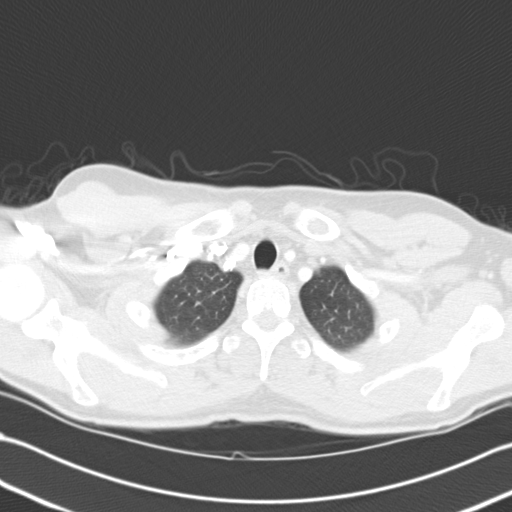

[5 of 36 positions shown; findings below may reference images not displayed]

FINDINGS: Aorta normal caliber without aneurysm or dissection.
Pulmonary arteries patent.
No evidence pulmonary embolism.
Visualized portions of liver and spleen normal appearance.
No thoracic adenopathy.
Thyroid lobes unremarkable.
Minimal dependent atelectasis at the lower lobes.
Lungs otherwise clear.
No pleural effusion or pneumothorax.
Multiple lytic foci and focal cortical lucencies are identified
throughout the spine, sternum, left clavicle, left scapula, and
scattered ribs.
Question healing fracture anterior right fourth rib.
Overall, bone mineral density appears decreased versus of the prior
CT.

Review of the MIP images confirms the above findings.
IMPRESSION: No evidence of pulmonary embolism.
Numerous lytic foci throughout osseous structures of the thorax,
multiple of which show focal areas of cortical lucency/destruction.
Findings are most suggestive of multiple myeloma.
Other diagnostic considerations would include lymphoma and
metastatic disease.

Findings called to Dr. Mateu on 05/15/2011 at 9115 hours.

## 2013-04-06 ENCOUNTER — Other Ambulatory Visit: Payer: PRIVATE HEALTH INSURANCE | Admitting: Lab

## 2013-04-13 ENCOUNTER — Other Ambulatory Visit: Payer: PRIVATE HEALTH INSURANCE | Admitting: Lab

## 2013-04-20 ENCOUNTER — Other Ambulatory Visit: Payer: PRIVATE HEALTH INSURANCE | Admitting: Lab

## 2013-04-21 ENCOUNTER — Other Ambulatory Visit: Payer: PRIVATE HEALTH INSURANCE | Admitting: Lab

## 2013-04-22 ENCOUNTER — Encounter: Payer: Self-pay | Admitting: Oncology

## 2013-04-22 ENCOUNTER — Other Ambulatory Visit: Payer: Self-pay | Admitting: Oncology

## 2013-04-22 DIAGNOSIS — C9000 Multiple myeloma not having achieved remission: Secondary | ICD-10-CM

## 2013-04-26 ENCOUNTER — Ambulatory Visit (HOSPITAL_BASED_OUTPATIENT_CLINIC_OR_DEPARTMENT_OTHER)
Admission: RE | Admit: 2013-04-26 | Discharge: 2013-04-26 | Disposition: A | Discharge status: Home / Self Care | Payer: PRIVATE HEALTH INSURANCE | Source: Ambulatory Visit | Attending: Oncology | Admitting: Oncology

## 2013-04-26 ENCOUNTER — Ambulatory Visit (HOSPITAL_BASED_OUTPATIENT_CLINIC_OR_DEPARTMENT_OTHER): Payer: PRIVATE HEALTH INSURANCE

## 2013-04-26 DIAGNOSIS — M5030 Other cervical disc degeneration, unspecified cervical region: Secondary | ICD-10-CM | POA: Insufficient documentation

## 2013-04-26 DIAGNOSIS — C9000 Multiple myeloma not having achieved remission: Secondary | ICD-10-CM | POA: Insufficient documentation

## 2013-04-26 MED ORDER — GADOBENATE DIMEGLUMINE 529 MG/ML IV SOLN
14.0000 mL | Freq: Once | INTRAVENOUS | Status: AC | PRN
  Administered 2013-04-26: 14 mL via INTRAVENOUS

## 2013-04-27 ENCOUNTER — Other Ambulatory Visit (HOSPITAL_BASED_OUTPATIENT_CLINIC_OR_DEPARTMENT_OTHER): Payer: PRIVATE HEALTH INSURANCE | Admitting: Lab

## 2013-04-27 ENCOUNTER — Ambulatory Visit (HOSPITAL_BASED_OUTPATIENT_CLINIC_OR_DEPARTMENT_OTHER): Payer: PRIVATE HEALTH INSURANCE

## 2013-04-27 VITALS — BP 115/68 | HR 72 | Temp 98.5°F | Resp 20

## 2013-04-27 DIAGNOSIS — C9000 Multiple myeloma not having achieved remission: Secondary | ICD-10-CM

## 2013-04-27 DIAGNOSIS — D702 Other drug-induced agranulocytosis: Secondary | ICD-10-CM

## 2013-04-27 DIAGNOSIS — M899 Disorder of bone, unspecified: Secondary | ICD-10-CM

## 2013-04-27 LAB — COMPREHENSIVE METABOLIC PANEL (CC13)
ALT: 18 U/L (ref 0–55)
AST: 16 U/L (ref 5–34)
Albumin: 3.9 g/dL (ref 3.5–5.0)
BUN: 7.3 mg/dL (ref 7.0–26.0)
Calcium: 8.9 mg/dL (ref 8.4–10.4)
Chloride: 108 mEq/L (ref 98–109)
Potassium: 3.7 mEq/L (ref 3.5–5.1)
Sodium: 142 mEq/L (ref 136–145)
Total Protein: 6.8 g/dL (ref 6.4–8.3)

## 2013-04-27 LAB — CBC WITH DIFFERENTIAL/PLATELET
BASO%: 0.2 % (ref 0.0–2.0)
Basophils Absolute: 0 10*3/uL (ref 0.0–0.1)
EOS%: 2.8 % (ref 0.0–7.0)
HGB: 14.6 g/dL (ref 13.0–17.1)
MCH: 32.4 pg (ref 27.2–33.4)
RBC: 4.51 10*6/uL (ref 4.20–5.82)
RDW: 14.3 % (ref 11.0–14.6)
lymph#: 1.3 10*3/uL (ref 0.9–3.3)

## 2013-04-27 MED ORDER — ZOLEDRONIC ACID 4 MG/100ML IV SOLN
4.0000 mg | Freq: Once | INTRAVENOUS | Status: AC
  Administered 2013-04-27: 4 mg via INTRAVENOUS
  Filled 2013-04-27: qty 100

## 2013-04-27 MED ORDER — SODIUM CHLORIDE 0.9 % IV SOLN
Freq: Once | INTRAVENOUS | Status: AC
  Administered 2013-04-27: 15:00:00 via INTRAVENOUS

## 2013-04-27 NOTE — Patient Instructions (Addendum)
Zoledronic Acid injection (Paget's Disease, Osteoporosis) What is this medicine? ZOLEDRONIC ACID (ZOE le dron ik AS id) lowers the amount of calcium loss from bone. It is used to treat Paget's disease and osteoporosis in women. This medicine may be used for other purposes; ask your health care provider or pharmacist if you have questions. What should I tell my health care provider before I take this medicine? They need to know if you have any of these conditions: -aspirin-sensitive asthma -dental disease -kidney disease -low levels of calcium in the blood -past surgery on the parathyroid gland or intestines -an unusual or allergic reaction to zoledronic acid, other medicines, foods, dyes, or preservatives -pregnant or trying to get pregnant -breast-feeding How should I use this medicine? This medicine is for infusion into a vein. It is given by a health care professional in a hospital or clinic setting. Talk to your pediatrician regarding the use of this medicine in children. This medicine is not approved for use in children. Overdosage: If you think you have taken too much of this medicine contact a poison control center or emergency room at once. NOTE: This medicine is only for you. Do not share this medicine with others. What if I miss a dose? It is important not to miss your dose. Call your doctor or health care professional if you are unable to keep an appointment. What may interact with this medicine? -certain antibiotics given by injection -NSAIDs, medicines for pain and inflammation, like ibuprofen or naproxen -some diuretics like bumetanide, furosemide -teriparatide This list may not describe all possible interactions. Give your health care provider a list of all the medicines, herbs, non-prescription drugs, or dietary supplements you use. Also tell them if you smoke, drink alcohol, or use illegal drugs. Some items may interact with your medicine. What should I watch for while  using this medicine? Visit your doctor or health care professional for regular checkups. It may be some time before you see the benefit from this medicine. Do not stop taking your medicine unless your doctor tells you to. Your doctor may order blood tests or other tests to see how you are doing. Women should inform their doctor if they wish to become pregnant or think they might be pregnant. There is a potential for serious side effects to an unborn child. Talk to your health care professional or pharmacist for more information. You should make sure that you get enough calcium and vitamin D while you are taking this medicine. Discuss the foods you eat and the vitamins you take with your health care professional. Some people who take this medicine have severe bone, joint, and/or muscle pain. This medicine may also increase your risk for a broken thigh bone. Tell your doctor right away if you have pain in your upper leg or groin. Tell your doctor if you have any pain that does not go away or that gets worse. What side effects may I notice from receiving this medicine? Side effects that you should report to your doctor or health care professional as soon as possible: -allergic reactions like skin rash, itching or hives, swelling of the face, lips, or tongue -breathing problems -changes in vision -feeling faint or lightheaded, falls -jaw burning, cramping, or pain -muscle cramps, stiffness, or weakness -trouble passing urine or change in the amount of urine Side effects that usually do not require medical attention (report to your doctor or health care professional if they continue or are bothersome): -bone, joint, or muscle pain -fever -  irritation at site where injected -loss of appetite -nausea, vomiting -stomach upset -tired This list may not describe all possible side effects. Call your doctor for medical advice about side effects. You may report side effects to FDA at 1-800-FDA-1088. Where  should I keep my medicine? This drug is given in a hospital or clinic and will not be stored at home. NOTE: This sheet is a summary. It may not cover all possible information. If you have questions about this medicine, talk to your doctor, pharmacist, or health care provider.  2012, [employer]. (01/24/2011 9:08:15 AM) 

## 2013-04-28 LAB — KAPPA/LAMBDA LIGHT CHAINS
Kappa free light chain: 0.57 mg/dL (ref 0.33–1.94)
Kappa:Lambda Ratio: 0.47 (ref 0.26–1.65)

## 2013-05-17 ENCOUNTER — Telehealth: Payer: Self-pay | Admitting: *Deleted

## 2013-05-17 NOTE — Telephone Encounter (Signed)
Pr staff phone call I have adjusted 10/15 appt

## 2013-05-25 ENCOUNTER — Other Ambulatory Visit (HOSPITAL_BASED_OUTPATIENT_CLINIC_OR_DEPARTMENT_OTHER): Payer: PRIVATE HEALTH INSURANCE | Admitting: Lab

## 2013-05-25 ENCOUNTER — Other Ambulatory Visit: Payer: PRIVATE HEALTH INSURANCE | Admitting: Lab

## 2013-05-25 ENCOUNTER — Ambulatory Visit (HOSPITAL_BASED_OUTPATIENT_CLINIC_OR_DEPARTMENT_OTHER): Payer: PRIVATE HEALTH INSURANCE

## 2013-05-25 VITALS — BP 110/75 | HR 75 | Temp 97.1°F

## 2013-05-25 DIAGNOSIS — C9000 Multiple myeloma not having achieved remission: Secondary | ICD-10-CM

## 2013-05-25 DIAGNOSIS — D702 Other drug-induced agranulocytosis: Secondary | ICD-10-CM

## 2013-05-25 DIAGNOSIS — M898X9 Other specified disorders of bone, unspecified site: Secondary | ICD-10-CM

## 2013-05-25 DIAGNOSIS — M899 Disorder of bone, unspecified: Secondary | ICD-10-CM

## 2013-05-25 LAB — CBC WITH DIFFERENTIAL/PLATELET
BASO%: 3.3 % — ABNORMAL HIGH (ref 0.0–2.0)
Basophils Absolute: 0.1 10*3/uL (ref 0.0–0.1)
EOS%: 10.2 % — ABNORMAL HIGH (ref 0.0–7.0)
Eosinophils Absolute: 0.3 10*3/uL (ref 0.0–0.5)
HCT: 42.5 % (ref 38.4–49.9)
HGB: 14.7 g/dL (ref 13.0–17.1)
LYMPH%: 34 % (ref 14.0–49.0)
MCH: 32.4 pg (ref 27.2–33.4)
MCHC: 34.7 g/dL (ref 32.0–36.0)
MCV: 93.3 fL (ref 79.3–98.0)
MONO#: 0.4 10*3/uL (ref 0.1–0.9)
MONO%: 14.6 % — ABNORMAL HIGH (ref 0.0–14.0)
NEUT#: 1.1 10*3/uL — ABNORMAL LOW (ref 1.5–6.5)
NEUT%: 37.9 % — ABNORMAL LOW (ref 39.0–75.0)
Platelets: 109 10*3/uL — ABNORMAL LOW (ref 140–400)
RBC: 4.55 10*6/uL (ref 4.20–5.82)
RDW: 13.8 % (ref 11.0–14.6)
WBC: 2.9 10*3/uL — ABNORMAL LOW (ref 4.0–10.3)
lymph#: 1 10*3/uL (ref 0.9–3.3)

## 2013-05-25 LAB — COMPREHENSIVE METABOLIC PANEL (CC13)
ALT: 15 U/L (ref 0–55)
AST: 17 U/L (ref 5–34)
Albumin: 3.9 g/dL (ref 3.5–5.0)
Alkaline Phosphatase: 50 U/L (ref 40–150)
Anion Gap: 7 mEq/L (ref 3–11)
BUN: 6.6 mg/dL — ABNORMAL LOW (ref 7.0–26.0)
CO2: 27 mEq/L (ref 22–29)
Calcium: 8.6 mg/dL (ref 8.4–10.4)
Chloride: 109 mEq/L (ref 98–109)
Creatinine: 1.1 mg/dL (ref 0.7–1.3)
Glucose: 86 mg/dl (ref 70–140)
Potassium: 3.5 mEq/L (ref 3.5–5.1)
Sodium: 143 mEq/L (ref 136–145)
Total Bilirubin: 0.53 mg/dL (ref 0.20–1.20)
Total Protein: 6.6 g/dL (ref 6.4–8.3)

## 2013-05-25 MED ORDER — ZOLEDRONIC ACID 4 MG/100ML IV SOLN
4.0000 mg | Freq: Once | INTRAVENOUS | Status: AC
  Administered 2013-05-25: 4 mg via INTRAVENOUS
  Filled 2013-05-25: qty 100

## 2013-05-25 MED ORDER — SODIUM CHLORIDE 0.9 % IJ SOLN
10.0000 mL | INTRAMUSCULAR | Status: DC | PRN
  Filled 2013-05-25: qty 10

## 2013-05-25 NOTE — Patient Instructions (Signed)
Zoledronic Acid injection (Hypercalcemia, Oncology) What is this medicine? ZOLEDRONIC ACID (ZOE le dron ik AS id) lowers the amount of calcium loss from bone. It is used to treat too much calcium in your blood from cancer. It is also used to prevent complications of cancer that has spread to the bone. This medicine may be used for other purposes; ask your health care provider or pharmacist if you have questions. What should I tell my health care provider before I take this medicine? They need to know if you have any of these conditions: -aspirin-sensitive asthma -dental disease -kidney disease -an unusual or allergic reaction to zoledronic acid, other medicines, foods, dyes, or preservatives -pregnant or trying to get pregnant -breast-feeding How should I use this medicine? This medicine is for infusion into a vein. It is given by a health care professional in a hospital or clinic setting. Talk to your pediatrician regarding the use of this medicine in children. Special care may be needed. Overdosage: If you think you have taken too much of this medicine contact a poison control center or emergency room at once. NOTE: This medicine is only for you. Do not share this medicine with others. What if I miss a dose? It is important not to miss your dose. Call your doctor or health care professional if you are unable to keep an appointment. What may interact with this medicine? -certain antibiotics given by injection -NSAIDs, medicines for pain and inflammation, like ibuprofen or naproxen -some diuretics like bumetanide, furosemide -teriparatide -thalidomide This list may not describe all possible interactions. Give your health care provider a list of all the medicines, herbs, non-prescription drugs, or dietary supplements you use. Also tell them if you smoke, drink alcohol, or use illegal drugs. Some items may interact with your medicine. What should I watch for while using this medicine? Visit  your doctor or health care professional for regular checkups. It may be some time before you see the benefit from this medicine. Do not stop taking your medicine unless your doctor tells you to. Your doctor may order blood tests or other tests to see how you are doing. Women should inform their doctor if they wish to become pregnant or think they might be pregnant. There is a potential for serious side effects to an unborn child. Talk to your health care professional or pharmacist for more information. You should make sure that you get enough calcium and vitamin D while you are taking this medicine. Discuss the foods you eat and the vitamins you take with your health care professional. Some people who take this medicine have severe bone, joint, and/or muscle pain. This medicine may also increase your risk for a broken thigh bone. Tell your doctor right away if you have pain in your upper leg or groin. Tell your doctor if you have any pain that does not go away or that gets worse. What side effects may I notice from receiving this medicine? Side effects that you should report to your doctor or health care professional as soon as possible: -allergic reactions like skin rash, itching or hives, swelling of the face, lips, or tongue -anxiety, confusion, or depression -breathing problems -changes in vision -feeling faint or lightheaded, falls -jaw burning, cramping, pain -muscle cramps, stiffness, or weakness -trouble passing urine or change in the amount of urine Side effects that usually do not require medical attention (report to your doctor or health care professional if they continue or are bothersome): -bone, joint, or muscle pain -  fever -hair loss -irritation at site where injected -loss of appetite -nausea, vomiting -stomach upset -tired This list may not describe all possible side effects. Call your doctor for medical advice about side effects. You may report side effects to FDA at  1-800-FDA-1088. Where should I keep my medicine? This drug is given in a hospital or clinic and will not be stored at home. NOTE: This sheet is a summary. It may not cover all possible information. If you have questions about this medicine, talk to your doctor, pharmacist, or health care provider.  2012, Elsevier/Gold Standard. (01/24/2011 9:06:58 AM) 

## 2013-06-03 ENCOUNTER — Telehealth: Payer: Self-pay | Admitting: *Deleted

## 2013-06-03 NOTE — Telephone Encounter (Signed)
Per staff message I have moved appt for 12/11

## 2013-06-07 ENCOUNTER — Telehealth: Payer: Self-pay | Admitting: Oncology

## 2013-06-07 NOTE — Telephone Encounter (Signed)
Called pt and left message regarding lab, chemo and MD for november and December 2014

## 2013-06-14 ENCOUNTER — Telehealth: Payer: Self-pay | Admitting: *Deleted

## 2013-06-14 ENCOUNTER — Other Ambulatory Visit: Payer: Self-pay | Admitting: *Deleted

## 2013-06-14 ENCOUNTER — Ambulatory Visit: Payer: PRIVATE HEALTH INSURANCE | Admitting: Lab

## 2013-06-14 DIAGNOSIS — C9000 Multiple myeloma not having achieved remission: Secondary | ICD-10-CM

## 2013-06-14 NOTE — Telephone Encounter (Signed)
Per patient request I have moved 11/12 appt to earlier in the day

## 2013-06-16 ENCOUNTER — Telehealth: Payer: Self-pay | Admitting: Oncology

## 2013-06-16 ENCOUNTER — Other Ambulatory Visit: Payer: Self-pay

## 2013-06-16 LAB — UIFE/LIGHT CHAINS/TP QN, 24-HR UR
Alpha 1, Urine: DETECTED — AB
Alpha 2, Urine: DETECTED — AB
Beta, Urine: DETECTED — AB
Free Kappa/Lambda Ratio: 27.82 ratio — ABNORMAL HIGH (ref 2.04–10.37)
Free Lt Chn Excr Rate: 183.6 mg/d
Total Protein, Urine-Ur/day: 216 mg/d — ABNORMAL HIGH (ref 10–140)

## 2013-06-16 NOTE — Telephone Encounter (Signed)
Talked to patient and he is aware of all appts for december lab,md and Zometa

## 2013-06-22 ENCOUNTER — Other Ambulatory Visit (HOSPITAL_BASED_OUTPATIENT_CLINIC_OR_DEPARTMENT_OTHER): Payer: PRIVATE HEALTH INSURANCE | Admitting: Lab

## 2013-06-22 ENCOUNTER — Ambulatory Visit (HOSPITAL_BASED_OUTPATIENT_CLINIC_OR_DEPARTMENT_OTHER): Payer: PRIVATE HEALTH INSURANCE

## 2013-06-22 ENCOUNTER — Other Ambulatory Visit: Payer: Self-pay | Admitting: *Deleted

## 2013-06-22 DIAGNOSIS — C9000 Multiple myeloma not having achieved remission: Secondary | ICD-10-CM

## 2013-06-22 DIAGNOSIS — M899 Disorder of bone, unspecified: Secondary | ICD-10-CM

## 2013-06-22 DIAGNOSIS — D702 Other drug-induced agranulocytosis: Secondary | ICD-10-CM

## 2013-06-22 LAB — CBC WITH DIFFERENTIAL/PLATELET
Basophils Absolute: 0.1 10*3/uL (ref 0.0–0.1)
EOS%: 7.7 % — ABNORMAL HIGH (ref 0.0–7.0)
Eosinophils Absolute: 0.2 10*3/uL (ref 0.0–0.5)
HCT: 41.8 % (ref 38.4–49.9)
HGB: 15 g/dL (ref 13.0–17.1)
MCH: 32.4 pg (ref 27.2–33.4)
MONO#: 0.4 10*3/uL (ref 0.1–0.9)
MONO%: 14 % (ref 0.0–14.0)
NEUT#: 1 10*3/uL — ABNORMAL LOW (ref 1.5–6.5)
NEUT%: 34.7 % — ABNORMAL LOW (ref 39.0–75.0)
RBC: 4.63 10*6/uL (ref 4.20–5.82)
lymph#: 1.2 10*3/uL (ref 0.9–3.3)
nRBC: 0 % (ref 0–0)

## 2013-06-22 LAB — COMPREHENSIVE METABOLIC PANEL (CC13)
ALT: 19 U/L (ref 0–55)
Albumin: 4.1 g/dL (ref 3.5–5.0)
Anion Gap: 10 mEq/L (ref 3–11)
BUN: 4.3 mg/dL — ABNORMAL LOW (ref 7.0–26.0)
CO2: 24 mEq/L (ref 22–29)
Calcium: 8.7 mg/dL (ref 8.4–10.4)
Chloride: 108 mEq/L (ref 98–109)
Glucose: 101 mg/dl (ref 70–140)
Potassium: 3.5 mEq/L (ref 3.5–5.1)
Sodium: 142 mEq/L (ref 136–145)
Total Bilirubin: 0.51 mg/dL (ref 0.20–1.20)
Total Protein: 6.9 g/dL (ref 6.4–8.3)

## 2013-06-22 MED ORDER — ZOLEDRONIC ACID 4 MG/100ML IV SOLN
4.0000 mg | Freq: Once | INTRAVENOUS | Status: AC
  Administered 2013-06-22: 4 mg via INTRAVENOUS
  Filled 2013-06-22: qty 100

## 2013-06-22 NOTE — Patient Instructions (Signed)

## 2013-06-23 ENCOUNTER — Telehealth: Payer: Self-pay | Admitting: Oncology

## 2013-06-23 NOTE — Telephone Encounter (Signed)
Pt aware of appt lab tomorrow per pt rqst

## 2013-06-24 ENCOUNTER — Other Ambulatory Visit (HOSPITAL_BASED_OUTPATIENT_CLINIC_OR_DEPARTMENT_OTHER): Payer: PRIVATE HEALTH INSURANCE

## 2013-06-24 DIAGNOSIS — C9000 Multiple myeloma not having achieved remission: Secondary | ICD-10-CM

## 2013-06-26 ENCOUNTER — Other Ambulatory Visit: Payer: Self-pay | Admitting: Oncology

## 2013-06-27 LAB — KAPPA/LAMBDA LIGHT CHAINS
Kappa free light chain: 1.04 mg/dL (ref 0.33–1.94)
Kappa:Lambda Ratio: 0.75 (ref 0.26–1.65)

## 2013-07-20 ENCOUNTER — Ambulatory Visit: Payer: PRIVATE HEALTH INSURANCE

## 2013-07-20 ENCOUNTER — Other Ambulatory Visit: Payer: PRIVATE HEALTH INSURANCE | Admitting: Lab

## 2013-07-20 ENCOUNTER — Ambulatory Visit: Payer: PRIVATE HEALTH INSURANCE | Admitting: Oncology

## 2013-07-21 ENCOUNTER — Ambulatory Visit (HOSPITAL_BASED_OUTPATIENT_CLINIC_OR_DEPARTMENT_OTHER): Payer: PRIVATE HEALTH INSURANCE

## 2013-07-21 ENCOUNTER — Other Ambulatory Visit (HOSPITAL_BASED_OUTPATIENT_CLINIC_OR_DEPARTMENT_OTHER): Payer: PRIVATE HEALTH INSURANCE

## 2013-07-21 ENCOUNTER — Other Ambulatory Visit: Payer: Self-pay | Admitting: *Deleted

## 2013-07-21 VITALS — BP 122/76 | HR 78 | Temp 98.0°F

## 2013-07-21 DIAGNOSIS — C9000 Multiple myeloma not having achieved remission: Secondary | ICD-10-CM

## 2013-07-21 DIAGNOSIS — M899 Disorder of bone, unspecified: Secondary | ICD-10-CM

## 2013-07-21 DIAGNOSIS — D702 Other drug-induced agranulocytosis: Secondary | ICD-10-CM

## 2013-07-21 LAB — CBC WITH DIFFERENTIAL/PLATELET
BASO%: 3.4 % — ABNORMAL HIGH (ref 0.0–2.0)
Basophils Absolute: 0.1 10*3/uL (ref 0.0–0.1)
EOS%: 9 % — ABNORMAL HIGH (ref 0.0–7.0)
HCT: 43.7 % (ref 38.4–49.9)
HGB: 15.2 g/dL (ref 13.0–17.1)
LYMPH%: 28.3 % (ref 14.0–49.0)
MCH: 32.6 pg (ref 27.2–33.4)
MCHC: 34.7 g/dL (ref 32.0–36.0)
MONO#: 0.5 10*3/uL (ref 0.1–0.9)
MONO%: 15.8 % — ABNORMAL HIGH (ref 0.0–14.0)
NEUT%: 43.5 % (ref 39.0–75.0)
Platelets: 122 10*3/uL — ABNORMAL LOW (ref 140–400)
RBC: 4.65 10*6/uL (ref 4.20–5.82)
lymph#: 0.8 10*3/uL — ABNORMAL LOW (ref 0.9–3.3)

## 2013-07-21 LAB — COMPREHENSIVE METABOLIC PANEL (CC13)
Alkaline Phosphatase: 93 U/L (ref 40–150)
Anion Gap: 9 mEq/L (ref 3–11)
BUN: 10.2 mg/dL (ref 7.0–26.0)
CO2: 28 mEq/L (ref 22–29)
Calcium: 9.1 mg/dL (ref 8.4–10.4)
Chloride: 105 mEq/L (ref 98–109)
Creatinine: 1 mg/dL (ref 0.7–1.3)

## 2013-07-21 MED ORDER — ZOLEDRONIC ACID 4 MG/100ML IV SOLN
4.0000 mg | Freq: Once | INTRAVENOUS | Status: AC
  Administered 2013-07-21: 4 mg via INTRAVENOUS
  Filled 2013-07-21: qty 100

## 2013-07-21 NOTE — Patient Instructions (Signed)

## 2013-07-26 ENCOUNTER — Other Ambulatory Visit: Payer: PRIVATE HEALTH INSURANCE | Admitting: Lab

## 2013-07-26 ENCOUNTER — Ambulatory Visit (HOSPITAL_BASED_OUTPATIENT_CLINIC_OR_DEPARTMENT_OTHER): Payer: PRIVATE HEALTH INSURANCE | Admitting: Oncology

## 2013-07-26 ENCOUNTER — Other Ambulatory Visit: Payer: PRIVATE HEALTH INSURANCE

## 2013-07-26 ENCOUNTER — Telehealth: Payer: Self-pay | Admitting: Oncology

## 2013-07-26 VITALS — BP 116/65 | HR 67 | Temp 97.4°F | Resp 18 | Ht 67.0 in | Wt 159.9 lb

## 2013-07-26 DIAGNOSIS — D709 Neutropenia, unspecified: Secondary | ICD-10-CM

## 2013-07-26 DIAGNOSIS — C9000 Multiple myeloma not having achieved remission: Secondary | ICD-10-CM

## 2013-07-26 DIAGNOSIS — C9001 Multiple myeloma in remission: Secondary | ICD-10-CM

## 2013-07-26 NOTE — Telephone Encounter (Signed)
gv pt appt schedule for january and march and lb for 08/09/13 to be done @ [facility]. pt aware [facility] will call re xray appts. per pt he is to have lb/zometa again next month (jan) and would prefer 08/17/13 due to his work schedule. appt schedule. according to q28 day zometa hyper llink attached to 12/16 pof and standing lb order. no further appts schedule due to per pt this frequency could change after his visit @ [facility] next month and he will call JG after that visit.

## 2013-07-26 NOTE — Progress Notes (Signed)
Hematology and Oncology Follow Up Visit  Kailyn Dubie [id number] [birth year] 37 y.o. 07/26/2013 7:19 PM   Principle Diagnosis: Encounter Diagnosis  Name Primary?  . 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 He received initial induction treatment with RVD and then Cytoxan IV conditioning, stem cell harvest, then high dose IV melphalan with stem cell support on 10/01/2011. 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  He continues on Revlimid maintenance 5 mg daily.  He has had prolonged neutropenia which has persisted despite dose adjustments of his Revlimid. He has had mild thrombocytopenia but consistently over 100,000.  No anemia.  Serum free light chains remain normal with a normal kappa to lambda ratio with most recent value  on 06/24/2013 with kappa free light chains 1.04 mg percent, lambda 1.38 ratio 0.75.   He has had no interim infections.  He denies any active bone pain. He was having some pain in his neck. I obtained an MRI of the cervical spine on 04/26/2013 and this did not show any evidence for active myeloma.   Medications: reviewed  Allergies:  Allergies  Allergen Reactions  . Shellfish Allergy Itching    Itching throat Unknown, tickling in throat  . Silver Itching    tegaderm dsg    Review of Systems: Hematology:  No bleeding ENT ROS: No sore throat Breast ROS:  Respiratory ROS: No cough or dyspnea Cardiovascular ROS:   No chest pain or palpitations Gastrointestinal ROS: No abdominal pain Genito-Urinary ROS no urinary tract symptoms Musculoskeletal ROS: See above Neurological ROS: No headache or change in vision Dermatological ROS: No rash or ecchymosis Remaining ROS negative.  Physical Exam: Blood  pressure 116/65, pulse 67, temperature 97.4 F (36.3 C), temperature source Oral, resp. rate 18, height 5\' 7"  (1.702 m), weight 159 lb 14.4 oz (72.53 kg). Wt Readings from Last 3 Encounters:  07/26/13 159 lb 14.4 oz (72.53 kg)  03/21/13 156 lb 9.6 oz (71.033 kg)  11/16/12 153 lb 14.4 oz (69.809 kg)     General appearance: Well-nourished Caucasian man HENNT: Pharynx no erythema, exudate, mass, or ulcer. No thyromegaly or thyroid nodules Lymph nodes: No cervical, supraclavicular, or axillary lymphadenopathy Breasts:  Lungs: Clear to auscultation, resonant to percussion throughout Heart: Regular rhythm, no murmur, no gallop, no rub, no click, no edema Abdomen: Soft, nontender, normal bowel sounds, no mass, no organomegaly Extremities: No edema, no calf tenderness Musculoskeletal: no joint deformities GU: Vascular: Carotid pulses 2+, no bruits, Neurologic: Alert, oriented, PERRLA, optic discs sharp and vessels normal, no hemorrhage or exudate, cranial nerves grossly normal, motor strength 5 over 5, reflexes 1+ symmetric, upper body coordination normal, gait normal, sensation intact to vibration over the fingertips by tuning fork exam Skin: No rash or ecchymosis  Lab Results: CBC W/Diff    Component Value Date/Time   WBC 3.0* 07/21/2013 0808   WBC 3.4* 07/06/2012 0839   WBC 4.3 08/26/2011 0721   RBC 4.65 07/21/2013 0808   RBC 4.50 08/26/2011 0721   HGB 15.2 07/21/2013 0808   HGB 14.8 07/06/2012 0839   HGB 13.9 08/26/2011 0721   HCT 43.7 07/21/2013 0808   HCT 42.4 07/06/2012 0839   HCT 40.2 08/26/2011 0721   PLT 122* 07/21/2013 0808   PLT 130* 07/06/2012 0839   PLT  125* 08/26/2011 0721   MCV 94.0 07/21/2013 0808   MCV 92 07/06/2012 0839   MCV 89.3 08/26/2011 0721   MCH 32.6 07/21/2013 0808   MCH 32.0 07/06/2012 0839   MCH 30.9 08/26/2011 0721   MCHC 34.7 07/21/2013 0808   MCHC 34.9 07/06/2012 0839   MCHC 34.6 08/26/2011 0721   RDW 13.4 07/21/2013 0808   RDW 13.1 07/06/2012 0839    RDW 13.0 08/26/2011 0721   LYMPHSABS 0.8* 07/21/2013 0808   LYMPHSABS 1.1 07/06/2012 0839   LYMPHSABS 1.2 08/26/2011 0721   MONOABS 0.5 07/21/2013 0808   MONOABS 1.0 08/26/2011 0721   EOSABS 0.3 07/21/2013 0808   EOSABS 0.2 07/06/2012 0839   EOSABS 0.4 08/26/2011 0721   BASOSABS 0.1 07/21/2013 0808   BASOSABS 0.1 07/06/2012 0839   BASOSABS 0.0 08/26/2011 0721     Chemistry      Component Value Date/Time   NA 141 07/21/2013 0807   NA 142 12/07/2012 1649   K 3.8 07/21/2013 0807   K 3.7 12/07/2012 1649   CL 106 02/01/2013 1514   CL 105 12/07/2012 1649   CO2 28 07/21/2013 0807   CO2 26 12/07/2012 1649   BUN 10.2 07/21/2013 0807   BUN 10 12/07/2012 1649   CREATININE 1.0 07/21/2013 0807   CREATININE 0.89 12/07/2012 1649   CREATININE 1.09 08/26/2011 1019      Component Value Date/Time   CALCIUM 9.1 07/21/2013 0807   CALCIUM 9.4 12/07/2012 1649   CALCIUM 10.6* 05/15/2011 1547   ALKPHOS 93 07/21/2013 0807   ALKPHOS 65 12/07/2012 1649   AST 64* 07/21/2013 0807   AST 23 12/07/2012 1649   ALT 145* 07/21/2013 0807   ALT 20 12/07/2012 1649   BILITOT 0.59 07/21/2013 0807   BILITOT 0.3 12/07/2012 1649      Impression:  #1.Kappa light chain multiple myeloma He remains in hematologic remission now out 26 months from diagnosis in October 2012 and 22 months from high-dose IV melphalan consolidation with autologous stem cell support in February 2013. Plan: He'll continue on low-dose Revlimid. He is still receiving monthly Zometa infusions. I have arbitrarily been decreasing the Zometa to every 3 months at the 2 year point. I will get followup skeletal x-rays after the new year. I will also get a chest radiograph is a routine survey for second malignancies on long-term Revlimid.   He has a followup at [facility] in February and they will do a bone marrow biopsy at that time.  #2. Chronic neutropenia since bone marrow transplant Likely combined effects of the high-dose IV  melphalan plus the Revlimid.  #3. Post transplant CMV pneumonia treated successfully and resolved.     CC: Patient Care Team: Lindley Magnus, MD as PCP - General   [name], MD 12/16/20147:19 PM

## 2013-08-09 ENCOUNTER — Other Ambulatory Visit (HOSPITAL_BASED_OUTPATIENT_CLINIC_OR_DEPARTMENT_OTHER): Payer: PRIVATE HEALTH INSURANCE | Admitting: Lab

## 2013-08-09 DIAGNOSIS — D702 Other drug-induced agranulocytosis: Secondary | ICD-10-CM

## 2013-08-09 DIAGNOSIS — C9000 Multiple myeloma not having achieved remission: Secondary | ICD-10-CM

## 2013-08-09 LAB — CBC WITH DIFFERENTIAL (CANCER CENTER ONLY)
BASO#: 0 10*3/uL (ref 0.0–0.2)
BASO%: 1.1 % (ref 0.0–2.0)
EOS%: 5 % (ref 0.0–7.0)
HCT: 45 % (ref 38.7–49.9)
HGB: 15.5 g/dL (ref 13.0–17.1)
LYMPH#: 1.2 10*3/uL (ref 0.9–3.3)
MCH: 32 pg (ref 28.0–33.4)
MCHC: 34.4 g/dL (ref 32.0–35.9)
MONO%: 16.6 % — ABNORMAL HIGH (ref 0.0–13.0)
NEUT%: 44.3 % (ref 40.0–80.0)
Platelets: 110 10*3/uL — ABNORMAL LOW (ref 145–400)
RDW: 12.8 % (ref 11.1–15.7)

## 2013-08-09 LAB — KAPPA/LAMBDA LIGHT CHAINS
Kappa free light chain: 1.19 mg/dL (ref 0.33–1.94)
Kappa:Lambda Ratio: 0.62 (ref 0.26–1.65)
Lambda Free Lght Chn: 1.91 mg/dL (ref 0.57–2.63)

## 2013-08-17 ENCOUNTER — Other Ambulatory Visit: Payer: Self-pay | Admitting: Oncology

## 2013-08-17 ENCOUNTER — Ambulatory Visit (HOSPITAL_COMMUNITY)
Admission: RE | Admit: 2013-08-17 | Discharge: 2013-08-17 | Disposition: A | Discharge status: Home / Self Care | Payer: PRIVATE HEALTH INSURANCE | Source: Ambulatory Visit | Attending: Oncology | Admitting: Oncology

## 2013-08-17 ENCOUNTER — Ambulatory Visit (HOSPITAL_BASED_OUTPATIENT_CLINIC_OR_DEPARTMENT_OTHER): Payer: PRIVATE HEALTH INSURANCE

## 2013-08-17 ENCOUNTER — Other Ambulatory Visit (HOSPITAL_BASED_OUTPATIENT_CLINIC_OR_DEPARTMENT_OTHER): Payer: PRIVATE HEALTH INSURANCE

## 2013-08-17 VITALS — BP 119/78 | HR 83 | Temp 98.2°F | Resp 18

## 2013-08-17 DIAGNOSIS — D702 Other drug-induced agranulocytosis: Secondary | ICD-10-CM

## 2013-08-17 DIAGNOSIS — M899 Disorder of bone, unspecified: Secondary | ICD-10-CM

## 2013-08-17 DIAGNOSIS — C9001 Multiple myeloma in remission: Secondary | ICD-10-CM

## 2013-08-17 DIAGNOSIS — C9000 Multiple myeloma not having achieved remission: Secondary | ICD-10-CM | POA: Insufficient documentation

## 2013-08-17 DIAGNOSIS — M949 Disorder of cartilage, unspecified: Secondary | ICD-10-CM

## 2013-08-17 LAB — COMPREHENSIVE METABOLIC PANEL (CC13)
ALBUMIN: 4.3 g/dL (ref 3.5–5.0)
ALT: 30 U/L (ref 0–55)
ANION GAP: 9 meq/L (ref 3–11)
AST: 23 U/L (ref 5–34)
Alkaline Phosphatase: 67 U/L (ref 40–150)
BUN: 10 mg/dL (ref 7.0–26.0)
CO2: 26 meq/L (ref 22–29)
Calcium: 9.2 mg/dL (ref 8.4–10.4)
Chloride: 107 mEq/L (ref 98–109)
Creatinine: 1.3 mg/dL (ref 0.7–1.3)
GLUCOSE: 128 mg/dL (ref 70–140)
POTASSIUM: 3.5 meq/L (ref 3.5–5.1)
SODIUM: 142 meq/L (ref 136–145)
Total Bilirubin: 0.64 mg/dL (ref 0.20–1.20)
Total Protein: 7.2 g/dL (ref 6.4–8.3)

## 2013-08-17 MED ORDER — ZOLEDRONIC ACID 4 MG/100ML IV SOLN
4.0000 mg | Freq: Once | INTRAVENOUS | Status: AC
  Administered 2013-08-17: 4 mg via INTRAVENOUS
  Filled 2013-08-17: qty 100

## 2013-08-17 MED ORDER — SODIUM CHLORIDE 0.9 % IV SOLN
Freq: Once | INTRAVENOUS | Status: AC
  Administered 2013-08-17: 10:00:00 via INTRAVENOUS

## 2013-08-17 NOTE — Patient Instructions (Signed)

## 2013-09-08 ENCOUNTER — Other Ambulatory Visit: Payer: Self-pay | Admitting: Oncology

## 2013-09-09 ENCOUNTER — Telehealth: Payer: Self-pay | Admitting: *Deleted

## 2013-09-09 NOTE — Telephone Encounter (Signed)
Pt called for labs before his zometa tx. gv appt for 09/28/13@ 8:45am...td

## 2013-09-09 NOTE — Telephone Encounter (Signed)
Per staff message and POF I have scheduled appts.  JMW  

## 2013-09-09 NOTE — Telephone Encounter (Signed)
Per staff message I have adjusted 2/18 appt

## 2013-09-12 ENCOUNTER — Telehealth: Payer: Self-pay | Admitting: *Deleted

## 2013-09-12 NOTE — Telephone Encounter (Signed)
Pt is already aware of his appt change. Pt is aware that hes to come on 10/19/13@ 11am...td

## 2013-09-14 ENCOUNTER — Other Ambulatory Visit: Payer: Self-pay | Admitting: *Deleted

## 2013-09-14 DIAGNOSIS — C9000 Multiple myeloma not having achieved remission: Secondary | ICD-10-CM

## 2013-09-19 ENCOUNTER — Other Ambulatory Visit: Payer: Self-pay | Admitting: Oncology

## 2013-09-28 ENCOUNTER — Ambulatory Visit (HOSPITAL_BASED_OUTPATIENT_CLINIC_OR_DEPARTMENT_OTHER): Payer: PRIVATE HEALTH INSURANCE

## 2013-09-28 ENCOUNTER — Other Ambulatory Visit (HOSPITAL_BASED_OUTPATIENT_CLINIC_OR_DEPARTMENT_OTHER): Payer: PRIVATE HEALTH INSURANCE

## 2013-09-28 VITALS — BP 113/79 | HR 62 | Temp 98.6°F | Resp 16

## 2013-09-28 DIAGNOSIS — C9000 Multiple myeloma not having achieved remission: Secondary | ICD-10-CM

## 2013-09-28 DIAGNOSIS — C9001 Multiple myeloma in remission: Secondary | ICD-10-CM

## 2013-09-28 DIAGNOSIS — M899 Disorder of bone, unspecified: Secondary | ICD-10-CM

## 2013-09-28 LAB — CBC WITH DIFFERENTIAL/PLATELET
BASO%: 2.3 % — ABNORMAL HIGH (ref 0.0–2.0)
BASOS ABS: 0.1 10*3/uL (ref 0.0–0.1)
EOS%: 5.2 % (ref 0.0–7.0)
Eosinophils Absolute: 0.2 10*3/uL (ref 0.0–0.5)
HEMATOCRIT: 46.6 % (ref 38.4–49.9)
HEMOGLOBIN: 15.8 g/dL (ref 13.0–17.1)
LYMPH#: 1.1 10*3/uL (ref 0.9–3.3)
LYMPH%: 33.1 % (ref 14.0–49.0)
MCH: 31.5 pg (ref 27.2–33.4)
MCHC: 33.8 g/dL (ref 32.0–36.0)
MCV: 93.1 fL (ref 79.3–98.0)
MONO#: 0.5 10*3/uL (ref 0.1–0.9)
MONO%: 15.8 % — ABNORMAL HIGH (ref 0.0–14.0)
NEUT#: 1.5 10*3/uL (ref 1.5–6.5)
NEUT%: 43.6 % (ref 39.0–75.0)
Platelets: 112 10*3/uL — ABNORMAL LOW (ref 140–400)
RBC: 5.01 10*6/uL (ref 4.20–5.82)
RDW: 13.7 % (ref 11.0–14.6)
WBC: 3.4 10*3/uL — ABNORMAL LOW (ref 4.0–10.3)

## 2013-09-28 LAB — BASIC METABOLIC PANEL (CC13)
Anion Gap: 9 mEq/L (ref 3–11)
BUN: 12.4 mg/dL (ref 7.0–26.0)
CHLORIDE: 109 meq/L (ref 98–109)
CO2: 26 mEq/L (ref 22–29)
CREATININE: 1.2 mg/dL (ref 0.7–1.3)
Calcium: 9.3 mg/dL (ref 8.4–10.4)
GLUCOSE: 86 mg/dL (ref 70–140)
POTASSIUM: 3.8 meq/L (ref 3.5–5.1)
Sodium: 144 mEq/L (ref 136–145)

## 2013-09-28 MED ORDER — SODIUM CHLORIDE 0.9 % IV SOLN
4.0000 mg | Freq: Once | INTRAVENOUS | Status: AC
  Administered 2013-09-28: 4 mg via INTRAVENOUS
  Filled 2013-09-28: qty 5

## 2013-09-28 MED ORDER — SODIUM CHLORIDE 0.9 % IV SOLN
Freq: Once | INTRAVENOUS | Status: AC
  Administered 2013-09-28: 09:00:00 via INTRAVENOUS

## 2013-09-28 NOTE — Patient Instructions (Signed)

## 2013-10-08 ENCOUNTER — Encounter: Payer: Self-pay | Admitting: Oncology

## 2013-10-17 ENCOUNTER — Other Ambulatory Visit: Payer: PRIVATE HEALTH INSURANCE

## 2013-10-17 DIAGNOSIS — C9000 Multiple myeloma not having achieved remission: Secondary | ICD-10-CM

## 2013-10-19 ENCOUNTER — Telehealth: Payer: Self-pay | Admitting: *Deleted

## 2013-10-19 ENCOUNTER — Ambulatory Visit (HOSPITAL_BASED_OUTPATIENT_CLINIC_OR_DEPARTMENT_OTHER): Payer: PRIVATE HEALTH INSURANCE | Admitting: Oncology

## 2013-10-19 ENCOUNTER — Telehealth: Payer: Self-pay | Admitting: Oncology

## 2013-10-19 VITALS — BP 118/82 | HR 80 | Temp 97.9°F | Resp 18 | Ht 67.0 in | Wt 158.8 lb

## 2013-10-19 DIAGNOSIS — D708 Other neutropenia: Secondary | ICD-10-CM

## 2013-10-19 DIAGNOSIS — C9001 Multiple myeloma in remission: Secondary | ICD-10-CM

## 2013-10-19 DIAGNOSIS — C9000 Multiple myeloma not having achieved remission: Secondary | ICD-10-CM

## 2013-10-19 LAB — KAPPA/LAMBDA LIGHT CHAINS
Kappa free light chain: 2.45 mg/dL — ABNORMAL HIGH (ref 0.33–1.94)
Kappa:Lambda Ratio: 1.39 (ref 0.26–1.65)
Lambda Free Lght Chn: 1.76 mg/dL (ref 0.57–2.63)

## 2013-10-19 NOTE — Progress Notes (Signed)
Hematology and Oncology Follow Up Visit  Ralph Barnett [id number] 18-Mar-1976 38 y.o. 10/19/2013 7:29 PM   Principle Diagnosis: Encounter Diagnosis  Name Primary?  . 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 He received initial induction treatment with RVD and then Cytoxan IV conditioning, stem cell harvest, then high dose IV melphalan with stem cell support on 10/01/2011. 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  He continues on Revlimid maintenance 5 mg daily.  He has had prolonged neutropenia which has persisted despite dose adjustments of his Revlimid. White count has improved and stabilized. Today's value 3400 with 44% neutrophils, 33 lymphocytes, 16 monocytes. Hemoglobin normal at 15.8. Platelet count remains borderline decreased but consistently over 100,000 and it is 112,000 today.  thrombocytopenia but consistently over 100,000.  Performance status is excellent. He is back to work full-time. He is not having any new bone pain. He had a self-limited gastroenteritis lasting for about 24 hours which has resolved. No cardiorespiratory symptoms.   Serum free light chains done on 10/17/2013 with borderline elevation of kappa 2.45 mg%, normal lambda 1.76 and normal ratio 1.39.   Medications: reviewed  Allergies:  Allergies  Allergen Reactions  . Shellfish Allergy Itching    Itching throat Unknown, tickling in throat  . Silver Itching    tegaderm dsg    Review of Systems: Hematology:  No bleeding or bruising ENT ROS: No sore throat Breast ROS:  Respiratory ROS: No cough or dyspnea Cardiovascular ROS:  No chest pain or palpitations Gastrointestinal ROS: Recent self-limited gastroenteritis   Genito-Urinary ROS: No urinary  tract symptoms Musculoskeletal ROS: No muscle or bone pain Neurological ROS: No headache or change in vision Dermatological ROS: No rash Remaining ROS negative:   Physical Exam: Blood pressure 118/82, pulse 80, temperature 97.9 F (36.6 C), temperature source Oral, resp. rate 18, height 5' 7"  (1.702 m), weight 158 lb 12.8 oz (72.031 kg), SpO2 100.00%. Wt Readings from Last 3 Encounters:  10/19/13 158 lb 12.8 oz (72.031 kg)  07/26/13 159 lb 14.4 oz (72.53 kg)  03/21/13 156 lb 9.6 oz (71.033 kg)     General appearance: Well-nourished Caucasian man HENNT: Pharynx no erythema, exudate, mass, or ulcer. No thyromegaly or thyroid nodules Lymph nodes: No cervical, supraclavicular, or axillary lymphadenopathy Breasts:  Lungs: Clear to auscultation, resonant to percussion throughout Heart: Regular rhythm, no murmur, no gallop, no rub, no click, no edema Abdomen: Soft, nontender, normal bowel sounds, no mass, no organomegaly Extremities: No edema, no calf tenderness Musculoskeletal: no joint deformities GU:  Vascular: Carotid pulses 2+, no bruits Neurologic: Alert, oriented, PERRLA  cranial nerves grossly normal, motor strength 5 over 5, reflexes 1+ symmetric, upper body coordination normal, gait normal, Skin: No rash or ecchymosis  Lab Results: CBC W/Diff    Component Value Date/Time   WBC 3.4* 09/28/2013 0850   WBC 3.6* 08/09/2013 0904   WBC 4.3 08/26/2011 0721   RBC 5.01 09/28/2013 0850   RBC 4.50 08/26/2011 0721   HGB 15.8 09/28/2013 0850   HGB 15.5 08/09/2013 0904   HGB 13.9 08/26/2011 0721   HCT 46.6 09/28/2013 0850   HCT 45.0 08/09/2013 0904   HCT 40.2 08/26/2011 0721   PLT 112* 09/28/2013 0850   PLT 110* 08/09/2013 0904   PLT 125* 08/26/2011 6063  MCV 93.1 09/28/2013 0850   MCV 93 08/09/2013 0904   MCV 89.3 08/26/2011 0721   MCH 31.5 09/28/2013 0850   MCH 32.0 08/09/2013 0904   MCH 30.9 08/26/2011 0721   MCHC 33.8 09/28/2013 0850   MCHC 34.4 08/09/2013 0904   MCHC 34.6 08/26/2011  0721   RDW 13.7 09/28/2013 0850   RDW 12.8 08/09/2013 0904   RDW 13.0 08/26/2011 0721   LYMPHSABS 1.1 09/28/2013 0850   LYMPHSABS 1.2 08/09/2013 0904   LYMPHSABS 1.2 08/26/2011 0721   MONOABS 0.5 09/28/2013 0850   MONOABS 1.0 08/26/2011 0721   EOSABS 0.2 09/28/2013 0850   EOSABS 0.2 08/09/2013 0904   EOSABS 0.4 08/26/2011 0721   BASOSABS 0.1 09/28/2013 0850   BASOSABS 0.0 08/09/2013 0904   BASOSABS 0.0 08/26/2011 0721     Chemistry      Component Value Date/Time   NA 144 09/28/2013 0850   NA 142 12/07/2012 1649   K 3.8 09/28/2013 0850   K 3.7 12/07/2012 1649   CL 106 02/01/2013 1514   CL 105 12/07/2012 1649   CO2 26 09/28/2013 0850   CO2 26 12/07/2012 1649   BUN 12.4 09/28/2013 0850   BUN 10 12/07/2012 1649   CREATININE 1.2 09/28/2013 0850   CREATININE 0.89 12/07/2012 1649   CREATININE 1.09 08/26/2011 1019      Component Value Date/Time   CALCIUM 9.3 09/28/2013 0850   CALCIUM 9.4 12/07/2012 1649   CALCIUM 10.6* 05/15/2011 1547   ALKPHOS 67 08/17/2013 0948   ALKPHOS 65 12/07/2012 1649   AST 23 08/17/2013 0948   AST 23 12/07/2012 1649   ALT 30 08/17/2013 0948   ALT 20 12/07/2012 1649   BILITOT 0.64 08/17/2013 0948   BILITOT 0.3 12/07/2012 1649        Impression:  #1. Kappa Light chain multiple myeloma treated as outlined above He remains free of any obvious progression now out 2-1/2 years out from diagnosis and 2 years out from high-dose IV melphalan with autologous stem cell support. He continues on low-dose Revlimid maintenance 5 mg daily. He is currently having labs checked every 6 weeks when he gets a dose of Zometa. Next scheduled dose April 1. Most recent bone survey 08/17/2013 showed no new or active lesions.  #2. 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 Chronic leukopenia following high-dose IV melphalan  I will transition his care to Dr. Benay Spice   CC: Patient Care Team: Lisabeth Pick, MD as PCP - General Ginette Pitman, MD   Annia Belt,  MD 3/11/20157:29 PM

## 2013-10-19 NOTE — Telephone Encounter (Signed)
gave pt appt for lab , md and Zometa , emailed michelle regarding rest of Zometa

## 2013-10-19 NOTE — Telephone Encounter (Signed)
Per staff message and POF I have scheduled appts.  JMW  

## 2013-10-19 NOTE — Telephone Encounter (Signed)
Talked to pt, he is aware of all appts due to my chart

## 2013-10-24 ENCOUNTER — Ambulatory Visit: Payer: PRIVATE HEALTH INSURANCE | Admitting: Oncology

## 2013-11-02 ENCOUNTER — Other Ambulatory Visit (HOSPITAL_BASED_OUTPATIENT_CLINIC_OR_DEPARTMENT_OTHER): Payer: PRIVATE HEALTH INSURANCE

## 2013-11-02 DIAGNOSIS — C9000 Multiple myeloma not having achieved remission: Secondary | ICD-10-CM

## 2013-11-02 DIAGNOSIS — C9001 Multiple myeloma in remission: Secondary | ICD-10-CM

## 2013-11-02 LAB — CBC WITH DIFFERENTIAL/PLATELET
BASO%: 3.5 % — ABNORMAL HIGH (ref 0.0–2.0)
BASOS ABS: 0.1 10*3/uL (ref 0.0–0.1)
EOS ABS: 0.2 10*3/uL (ref 0.0–0.5)
EOS%: 7.3 % — ABNORMAL HIGH (ref 0.0–7.0)
HEMATOCRIT: 46.3 % (ref 38.4–49.9)
HEMOGLOBIN: 15.4 g/dL (ref 13.0–17.1)
LYMPH%: 33.9 % (ref 14.0–49.0)
MCH: 30.9 pg (ref 27.2–33.4)
MCHC: 33.3 g/dL (ref 32.0–36.0)
MCV: 93 fL (ref 79.3–98.0)
MONO#: 0.6 10*3/uL (ref 0.1–0.9)
MONO%: 17.1 % — ABNORMAL HIGH (ref 0.0–14.0)
NEUT#: 1.2 10*3/uL — ABNORMAL LOW (ref 1.5–6.5)
NEUT%: 38.2 % — ABNORMAL LOW (ref 39.0–75.0)
PLATELETS: 124 10*3/uL — AB (ref 140–400)
RBC: 4.98 10*6/uL (ref 4.20–5.82)
RDW: 14 % (ref 11.0–14.6)
WBC: 3.2 10*3/uL — AB (ref 4.0–10.3)
lymph#: 1.1 10*3/uL (ref 0.9–3.3)

## 2013-11-02 LAB — COMPREHENSIVE METABOLIC PANEL (CC13)
ALT: 26 U/L (ref 0–55)
AST: 23 U/L (ref 5–34)
Albumin: 4.3 g/dL (ref 3.5–5.0)
Alkaline Phosphatase: 56 U/L (ref 40–150)
Anion Gap: 9 mEq/L (ref 3–11)
BILIRUBIN TOTAL: 0.55 mg/dL (ref 0.20–1.20)
BUN: 6.5 mg/dL — AB (ref 7.0–26.0)
CO2: 25 mEq/L (ref 22–29)
Calcium: 9.4 mg/dL (ref 8.4–10.4)
Chloride: 107 mEq/L (ref 98–109)
Creatinine: 1 mg/dL (ref 0.7–1.3)
GLUCOSE: 100 mg/dL (ref 70–140)
Potassium: 3.7 mEq/L (ref 3.5–5.1)
Sodium: 141 mEq/L (ref 136–145)
TOTAL PROTEIN: 7 g/dL (ref 6.4–8.3)

## 2013-11-03 LAB — KAPPA/LAMBDA LIGHT CHAINS
KAPPA FREE LGHT CHN: 1.65 mg/dL (ref 0.33–1.94)
Kappa:Lambda Ratio: 0.89 (ref 0.26–1.65)
LAMBDA FREE LGHT CHN: 1.85 mg/dL (ref 0.57–2.63)

## 2013-11-09 ENCOUNTER — Ambulatory Visit: Payer: PRIVATE HEALTH INSURANCE

## 2013-11-10 ENCOUNTER — Ambulatory Visit (HOSPITAL_BASED_OUTPATIENT_CLINIC_OR_DEPARTMENT_OTHER): Payer: PRIVATE HEALTH INSURANCE

## 2013-11-10 VITALS — BP 111/66 | HR 63 | Temp 98.1°F | Resp 20

## 2013-11-10 DIAGNOSIS — M899 Disorder of bone, unspecified: Secondary | ICD-10-CM

## 2013-11-10 DIAGNOSIS — C9000 Multiple myeloma not having achieved remission: Secondary | ICD-10-CM

## 2013-11-10 DIAGNOSIS — M949 Disorder of cartilage, unspecified: Secondary | ICD-10-CM

## 2013-11-10 MED ORDER — ZOLEDRONIC ACID 4 MG/100ML IV SOLN
4.0000 mg | Freq: Once | INTRAVENOUS | Status: AC
  Administered 2013-11-10: 4 mg via INTRAVENOUS
  Filled 2013-11-10: qty 100

## 2013-11-10 NOTE — Patient Instructions (Signed)

## 2013-11-17 ENCOUNTER — Telehealth: Payer: Self-pay | Admitting: *Deleted

## 2013-11-17 ENCOUNTER — Encounter: Payer: Self-pay | Admitting: Family Medicine

## 2013-11-17 ENCOUNTER — Ambulatory Visit (INDEPENDENT_AMBULATORY_CARE_PROVIDER_SITE_OTHER): Payer: PRIVATE HEALTH INSURANCE | Admitting: Family Medicine

## 2013-11-17 VITALS — BP 110/70 | HR 78 | Temp 98.3°F | Wt 154.0 lb

## 2013-11-17 DIAGNOSIS — J45909 Unspecified asthma, uncomplicated: Secondary | ICD-10-CM

## 2013-11-17 MED ORDER — AZITHROMYCIN 250 MG PO TABS: ORAL_TABLET | ORAL | Status: AC

## 2013-11-17 MED ORDER — ALBUTEROL SULFATE HFA 108 (90 BASE) MCG/ACT IN AERS: 2.0000 | INHALATION_SPRAY | Freq: Four times a day (QID) | RESPIRATORY_TRACT | Status: AC | PRN

## 2013-11-17 NOTE — Progress Notes (Signed)
Chief Complaint  Patient presents with  . Cough    fever last night; back pain with coughing-has resolved     HPI:  Cough and fever: -started: about 2 days ago, a little back pain earlier in the week now resolved -symptoms:nasal congestion for a few days, sore throat, productive cough, fever of 101 last night, a little wheezing -denies: SOB, NVD, tooth pain, diarrhea, vomiting, hemoptysis -has tried: used albuterol a few times and resolved symptoms, OTC analgesics -sick contacts/travel/risks: denies flu exposure, tick exposure or or Ebola risks -Hx of: asthma mild and symptoms only when sick, MM on zometa infusions with oncology - recent infusion  ROS: See pertinent positives and negatives per HPI.  Past Medical History  Diagnosis Date  . Asthma   . Kidney stone   . Multiple myeloma     Past Surgical History  Procedure Laterality Date  . Wrist surgery  1997    left wrist for scapular nonunion     Family History  Problem Relation Age of Onset  . Lymphoma Mother   . Atrial fibrillation Father   . Diabetes Neg Hx   . Heart attack Neg Hx   . Hyperlipidemia Neg Hx   . Hypertension Neg Hx   . Sudden death Neg Hx     History   Social History  . Marital Status: Married    Spouse Name: N/A    Number of Children: N/A  . Years of Education: N/A   Social History Main Topics  . Smoking status: Never Smoker   . Smokeless tobacco: None  . Alcohol Use: Yes  . Drug Use: No  . Sexual Activity: Yes   Other Topics Concern  . None   Social History Narrative  . None    Current outpatient prescriptions:albuterol (PROAIR HFA) 108 (90 BASE) MCG/ACT inhaler, Inhale 2 puffs into the lungs every 6 (six) hours as needed. Wheezing, Disp: 1 Inhaler, Rfl: 6;  aspirin 325 MG EC tablet, Take 325 mg by mouth daily with breakfast. , Disp: , Rfl: ;  lenalidomide (REVLIMID) 5 MG capsule, 10 mg. Take 10 mg by mouth daily. Adjust as directed per protocol., Disp: , Rfl:  loratadine (CLARITIN)  10 MG tablet, 10 mg. Take 10 mg by mouth daily as needed., Disp: , Rfl: ;  ondansetron (ZOFRAN-ODT) 8 MG disintegrating tablet, Take 8 mg by mouth every 8 (eight) hours as needed. Nausea , Disp: , Rfl: ;  azithromycin (ZITHROMAX) 250 MG tablet, 2 tabs on first day, then one tab daily, Disp: 6 tablet, Rfl: 0  EXAM:  Filed Vitals:   11/17/13 1113  BP: 110/70  Pulse: 78  Temp: 98.3 F (36.8 C)    Body mass index is 24.11 kg/(m^2).  GENERAL: vitals reviewed and listed above, alert, oriented, appears well hydrated and in no acute distress  HEENT: atraumatic, conjunttiva clear, no obvious abnormalities on inspection of external nose and ears, normal appearance of ear canals and TMs, thick nasal congestion, mild post oropharyngeal erythema with PND, no tonsillar edema or exudate, no sinus TTP  NECK: no obvious masses on inspection  LUNGS: clear to auscultation bilaterally, no wheezes, rales or rhonchi, good air movement  CV: HRRR, no peripheral edema  MS: moves all extremities without noticeable abnormality  PSYCH: pleasant and cooperative, no obvious depression or anxiety  ASSESSMENT AND PLAN:  Discussed the following assessment and plan:  ASTHMA - Plan: azithromycin (ZITHROMAX) 250 MG tablet, albuterol (PROAIR HFA) 108 (90 BASE) MCG/ACT inhaler  -given HPI  and exam findings today, a serious infection or illness is unlikely.  -We discussed potential etiologies, with VURI being most likely, vs asthma exacerbation or sinusitis . -We discussed treatment side effects, likely course, antibiotic -discussed less likely potential for influenza and offered tamiflu - declined -discussed prednisone or inhaled steroids and opted holding off on this -discussed abx choices and he requested azithromycin -of course, we advised to return or notify a doctor immediately if symptoms worsen or persist or new concerns arise.    There are no Patient Instructions on file for this visit.   Lucretia Kern

## 2013-11-17 NOTE — Progress Notes (Signed)
Pre visit review using our clinic review tool, if applicable. No additional management support is needed unless otherwise documented below in the visit note. 

## 2013-11-17 NOTE — Telephone Encounter (Signed)
Called patient to follow up on his call last night to our on-call physician - taken by "call-a-nurse." Dr. Benay Spice is concerned as patient is at high risk for infection.   Patient is feeling better.  He saw his PCP today who gave him a z-pack.  Let him know Dr. Benay Spice was concerned about him and he needs to monitor his sx closely.

## 2013-11-18 MED ORDER — OSELTAMIVIR PHOSPHATE 75 MG PO CAPS: 75.0000 mg | ORAL_CAPSULE | Freq: Two times a day (BID) | ORAL | Status: AC

## 2013-11-18 NOTE — Addendum Note (Signed)
Addended by: Colleen Can on: 11/18/2013 12:59 PM   Modules accepted: Orders

## 2013-12-20 ENCOUNTER — Other Ambulatory Visit: Payer: Self-pay | Admitting: Pharmacist

## 2013-12-20 DIAGNOSIS — C9000 Multiple myeloma not having achieved remission: Secondary | ICD-10-CM

## 2013-12-21 ENCOUNTER — Ambulatory Visit (HOSPITAL_BASED_OUTPATIENT_CLINIC_OR_DEPARTMENT_OTHER): Payer: PRIVATE HEALTH INSURANCE

## 2013-12-21 ENCOUNTER — Other Ambulatory Visit (HOSPITAL_BASED_OUTPATIENT_CLINIC_OR_DEPARTMENT_OTHER): Payer: PRIVATE HEALTH INSURANCE

## 2013-12-21 ENCOUNTER — Telehealth: Payer: Self-pay | Admitting: Oncology

## 2013-12-21 VITALS — BP 110/75 | HR 75 | Temp 98.4°F | Resp 20

## 2013-12-21 DIAGNOSIS — C9000 Multiple myeloma not having achieved remission: Secondary | ICD-10-CM

## 2013-12-21 DIAGNOSIS — M899 Disorder of bone, unspecified: Secondary | ICD-10-CM

## 2013-12-21 DIAGNOSIS — C9001 Multiple myeloma in remission: Secondary | ICD-10-CM

## 2013-12-21 MED ORDER — ZOLEDRONIC ACID 4 MG/100ML IV SOLN
4.0000 mg | Freq: Once | INTRAVENOUS | Status: AC
  Administered 2013-12-21: 4 mg via INTRAVENOUS
  Filled 2013-12-21: qty 100

## 2013-12-21 MED ORDER — SODIUM CHLORIDE 0.9 % IV SOLN
INTRAVENOUS | Status: DC
  Administered 2013-12-21: 10:00:00 via INTRAVENOUS

## 2013-12-21 NOTE — Telephone Encounter (Signed)
pt came in to cx all appts due to having all tx at [facility] per pt request

## 2013-12-21 NOTE — Patient Instructions (Signed)

## 2014-01-27 ENCOUNTER — Ambulatory Visit: Payer: PRIVATE HEALTH INSURANCE | Admitting: Oncology

## 2014-02-02 ENCOUNTER — Ambulatory Visit: Payer: PRIVATE HEALTH INSURANCE

## 2014-02-02 ENCOUNTER — Other Ambulatory Visit: Payer: PRIVATE HEALTH INSURANCE

## 2014-07-29 ENCOUNTER — Other Ambulatory Visit: Payer: Self-pay | Admitting: Nurse Practitioner

## 2023-11-24 ENCOUNTER — Other Ambulatory Visit: Payer: Self-pay | Admitting: Medical Genetics

## 2023-12-02 ENCOUNTER — Other Ambulatory Visit: Payer: PRIVATE HEALTH INSURANCE

## 2023-12-02 DIAGNOSIS — Z006 Encounter for examination for normal comparison and control in clinical research program: Secondary | ICD-10-CM

## 2023-12-15 LAB — GENECONNECT MOLECULAR SCREEN: Genetic Analysis Overall Interpretation: NEGATIVE
